# Patient Record
Sex: Female | Born: 1975 | Race: White | Hispanic: No | Marital: Single | Smoking: Current every day smoker
Health system: Southern US, Community
[De-identification: ages and names within clinical notes are randomized; demographics above are authoritative.]

## PROBLEM LIST (undated history)

## (undated) ENCOUNTER — Inpatient Hospital Stay (HOSPITAL_COMMUNITY): Payer: Self-pay

## (undated) DIAGNOSIS — F419 Anxiety disorder, unspecified: Secondary | ICD-10-CM

## (undated) DIAGNOSIS — G90521 Complex regional pain syndrome I of right lower limb: Secondary | ICD-10-CM

## (undated) DIAGNOSIS — O09529 Supervision of elderly multigravida, unspecified trimester: Secondary | ICD-10-CM

## (undated) DIAGNOSIS — F32A Depression, unspecified: Secondary | ICD-10-CM

## (undated) DIAGNOSIS — D219 Benign neoplasm of connective and other soft tissue, unspecified: Secondary | ICD-10-CM

## (undated) DIAGNOSIS — Z789 Other specified health status: Secondary | ICD-10-CM

## (undated) DIAGNOSIS — R112 Nausea with vomiting, unspecified: Secondary | ICD-10-CM

## (undated) DIAGNOSIS — F329 Major depressive disorder, single episode, unspecified: Secondary | ICD-10-CM

## (undated) DIAGNOSIS — Z9889 Other specified postprocedural states: Secondary | ICD-10-CM

## (undated) HISTORY — PX: DILATION AND CURETTAGE OF UTERUS: SHX78

## (undated) HISTORY — DX: Supervision of elderly multigravida, unspecified trimester: O09.529

## (undated) HISTORY — DX: Depression, unspecified: F32.A

## (undated) HISTORY — DX: Major depressive disorder, single episode, unspecified: F32.9

## (undated) HISTORY — DX: Benign neoplasm of connective and other soft tissue, unspecified: D21.9

---

## 2002-08-06 ENCOUNTER — Emergency Department (HOSPITAL_COMMUNITY): Admission: EM | Admit: 2002-08-06 | Discharge: 2002-08-07 | Payer: Self-pay | Admitting: Emergency Medicine

## 2002-08-07 ENCOUNTER — Encounter: Payer: Self-pay | Admitting: Emergency Medicine

## 2002-08-10 ENCOUNTER — Emergency Department (HOSPITAL_COMMUNITY): Admission: EM | Admit: 2002-08-10 | Discharge: 2002-08-10 | Payer: Self-pay | Admitting: *Deleted

## 2002-08-10 ENCOUNTER — Encounter: Payer: Self-pay | Admitting: *Deleted

## 2002-08-26 ENCOUNTER — Ambulatory Visit (HOSPITAL_COMMUNITY): Admission: RE | Admit: 2002-08-26 | Discharge: 2002-08-26 | Payer: Self-pay | Admitting: Unknown Physician Specialty

## 2002-08-26 ENCOUNTER — Encounter: Payer: Self-pay | Admitting: Unknown Physician Specialty

## 2002-09-07 ENCOUNTER — Encounter: Admission: RE | Admit: 2002-09-07 | Discharge: 2002-10-20 | Payer: Self-pay | Admitting: Unknown Physician Specialty

## 2008-02-16 ENCOUNTER — Other Ambulatory Visit: Admission: RE | Admit: 2008-02-16 | Discharge: 2008-02-16 | Payer: Self-pay | Admitting: Obstetrics and Gynecology

## 2008-09-07 ENCOUNTER — Inpatient Hospital Stay (HOSPITAL_COMMUNITY): Admission: AD | Admit: 2008-09-07 | Discharge: 2008-09-07 | Payer: Self-pay | Admitting: Family Medicine

## 2008-09-07 ENCOUNTER — Ambulatory Visit: Payer: Self-pay | Admitting: Obstetrics and Gynecology

## 2008-09-09 ENCOUNTER — Inpatient Hospital Stay (HOSPITAL_COMMUNITY): Admission: AD | Admit: 2008-09-09 | Discharge: 2008-09-09 | Payer: Self-pay | Admitting: Obstetrics and Gynecology

## 2008-09-09 ENCOUNTER — Ambulatory Visit: Payer: Self-pay | Admitting: Advanced Practice Midwife

## 2008-09-10 ENCOUNTER — Inpatient Hospital Stay (HOSPITAL_COMMUNITY): Admission: AD | Admit: 2008-09-10 | Discharge: 2008-09-11 | Payer: Self-pay | Admitting: Obstetrics & Gynecology

## 2008-09-10 ENCOUNTER — Ambulatory Visit: Payer: Self-pay | Admitting: Obstetrics & Gynecology

## 2008-09-13 ENCOUNTER — Inpatient Hospital Stay (HOSPITAL_COMMUNITY): Admission: AD | Admit: 2008-09-13 | Discharge: 2008-09-15 | Payer: Self-pay | Admitting: Obstetrics & Gynecology

## 2008-09-13 ENCOUNTER — Ambulatory Visit: Payer: Self-pay | Admitting: Family

## 2010-11-26 LAB — CBC
Hemoglobin: 12.2 g/dL (ref 12.0–15.0)
MCHC: 33.1 g/dL (ref 30.0–36.0)
RBC: 4.08 MIL/uL (ref 3.87–5.11)

## 2010-11-26 LAB — RPR: RPR Ser Ql: NONREACTIVE

## 2010-11-26 LAB — RH IMMUNE GLOB WKUP(>/=20WKS)(NOT WOMEN'S HOSP): Fetal Screen: NEGATIVE

## 2011-02-23 ENCOUNTER — Emergency Department (HOSPITAL_COMMUNITY): Payer: Self-pay

## 2011-02-23 ENCOUNTER — Emergency Department (HOSPITAL_COMMUNITY)
Admission: EM | Admit: 2011-02-23 | Discharge: 2011-02-23 | Disposition: A | Discharge status: Home / Self Care | Payer: Self-pay | Attending: Emergency Medicine | Admitting: Emergency Medicine

## 2011-02-23 ENCOUNTER — Encounter: Payer: Self-pay | Admitting: *Deleted

## 2011-02-23 DIAGNOSIS — S9000XA Contusion of unspecified ankle, initial encounter: Secondary | ICD-10-CM | POA: Insufficient documentation

## 2011-02-23 DIAGNOSIS — R609 Edema, unspecified: Secondary | ICD-10-CM | POA: Insufficient documentation

## 2011-02-23 DIAGNOSIS — F17200 Nicotine dependence, unspecified, uncomplicated: Secondary | ICD-10-CM | POA: Insufficient documentation

## 2011-02-23 DIAGNOSIS — W19XXXA Unspecified fall, initial encounter: Secondary | ICD-10-CM | POA: Insufficient documentation

## 2011-02-23 DIAGNOSIS — M25579 Pain in unspecified ankle and joints of unspecified foot: Secondary | ICD-10-CM | POA: Insufficient documentation

## 2011-02-23 DIAGNOSIS — T148XXA Other injury of unspecified body region, initial encounter: Secondary | ICD-10-CM

## 2011-02-23 MED ORDER — HYDROCODONE-ACETAMINOPHEN 5-325 MG PO TABS: 1.0000 | ORAL_TABLET | ORAL | Status: AC | PRN

## 2011-02-23 MED ORDER — IBUPROFEN 800 MG PO TABS: 800.0000 mg | ORAL_TABLET | Freq: Three times a day (TID) | ORAL | Status: AC

## 2011-02-23 MED ORDER — HYDROCODONE-ACETAMINOPHEN 5-325 MG PO TABS
1.0000 | ORAL_TABLET | Freq: Once | ORAL | Status: AC
  Administered 2011-02-23: 1 via ORAL
  Filled 2011-02-23: qty 1

## 2011-02-23 NOTE — ED Notes (Signed)
Family at bedside. 

## 2011-02-23 NOTE — ED Notes (Signed)
Pt states ran into a rocking chair, striking the side of rt ankle on the rocker bottom. States incident happened at 0900 and swelling started at 1500.  Pain has gradually increased during the day. Ice pack applied.

## 2011-02-26 NOTE — ED Provider Notes (Signed)
History     Chief Complaint  Patient presents with  . Ankle Pain    Pt c/o right ankle pain x 4 hrs   Patient is a 35 y.o. female presenting with ankle pain. The history is provided by the patient.  Ankle Pain  The incident occurred less than 1 hour ago. The incident occurred at home. The injury mechanism was a fall. The pain is present in the right ankle. The quality of the pain is described as aching. The pain is at a severity of 5/10. The pain is moderate. The pain has been constant since onset. Associated symptoms include inability to bear weight. Pertinent negatives include no numbness, no loss of motion, no loss of sensation and no tingling. She reports no foreign bodies present. The symptoms are aggravated by nothing. She has tried nothing for the symptoms.    History reviewed. No pertinent past medical history.  History reviewed. No pertinent past surgical history.  Family History  Problem Relation Age of Onset  . Hypertension Father     History  Substance Use Topics  . Smoking status: Current Everyday Smoker  . Smokeless tobacco: Not on file  . Alcohol Use: No    OB History    Grav Para Term Preterm Abortions TAB SAB Ect Mult Living                  Review of Systems  Constitutional: Negative for fever.  HENT: Negative for congestion, sore throat and neck pain.   Eyes: Negative.   Respiratory: Negative for chest tightness and shortness of breath.   Cardiovascular: Negative for chest pain.  Gastrointestinal: Negative for nausea and abdominal pain.  Genitourinary: Negative.   Musculoskeletal: Positive for joint swelling, arthralgias and gait problem. Negative for back pain.  Skin: Negative.  Negative for rash and wound.  Neurological: Negative for dizziness, tingling, weakness, light-headedness, numbness and headaches.  Hematological: Negative.   Psychiatric/Behavioral: Negative.     Physical Exam  BP 116/57  Pulse 75  Temp(Src) 98.4 F (36.9 C) (Oral)   Resp 18  Ht 5\' 9"  (1.753 m)  Wt 185 lb (83.915 kg)  BMI 27.32 kg/m2  SpO2 100%  LMP 02/03/2011  Physical Exam  Vitals reviewed. Constitutional: She is oriented to person, place, and time. She appears well-developed and well-nourished.  HENT:  Head: Normocephalic and atraumatic.  Eyes: Conjunctivae are normal.  Neck: Normal range of motion.  Cardiovascular: Normal rate.   Pulmonary/Chest: Effort normal. She has no wheezes.  Abdominal: Soft. There is no tenderness.  Musculoskeletal: She exhibits edema and tenderness.       Right ankle: She exhibits swelling and ecchymosis. She exhibits no deformity, no laceration and normal pulse. tenderness. Lateral malleolus tenderness found. No proximal fibula tenderness found. Achilles tendon normal.  Neurological: She is alert and oriented to person, place, and time.  Skin: Skin is warm and dry.  Psychiatric: She has a normal mood and affect.    ED Course  Procedures  MDM  Sprain ankle/soft tissue injury.  [place],  Crutches supplied by [employer], [place] 02/26/11 1142

## 2011-04-11 NOTE — ED Provider Notes (Signed)
Medical screening examination/treatment/procedure(s) were performed by non-physician practitioner and as supervising physician I was immediately available for consultation/collaboration.  Hurman Horn, MD 04/11/11 2112

## 2011-04-11 NOTE — ED Provider Notes (Signed)
Medical screening examination/treatment/procedure(s) were performed by non-physician practitioner and as supervising physician I was immediately available for consultation/collaboration.  Hurman Horn, MD 04/11/11 2110

## 2011-07-10 LAB — OB RESULTS CONSOLE ABO/RH: RH Type: NEGATIVE

## 2011-07-10 LAB — OB RESULTS CONSOLE RPR: RPR: NONREACTIVE

## 2011-07-10 LAB — OB RESULTS CONSOLE GC/CHLAMYDIA
Chlamydia: NEGATIVE
Gonorrhea: NEGATIVE

## 2011-07-10 LAB — OB RESULTS CONSOLE HEPATITIS B SURFACE ANTIGEN: Hepatitis B Surface Ag: NEGATIVE

## 2011-07-10 LAB — OB RESULTS CONSOLE RUBELLA ANTIBODY, IGM: Rubella: IMMUNE

## 2011-09-29 ENCOUNTER — Ambulatory Visit (HOSPITAL_COMMUNITY)
Admission: RE | Admit: 2011-09-29 | Discharge: 2011-09-29 | Disposition: A | Discharge status: Home / Self Care | Payer: BC Managed Care – PPO | Source: Ambulatory Visit | Attending: Obstetrics & Gynecology | Admitting: Obstetrics & Gynecology

## 2011-09-29 ENCOUNTER — Other Ambulatory Visit: Payer: Self-pay | Admitting: Obstetrics & Gynecology

## 2011-09-29 DIAGNOSIS — O1200 Gestational edema, unspecified trimester: Secondary | ICD-10-CM

## 2011-09-29 DIAGNOSIS — M7989 Other specified soft tissue disorders: Secondary | ICD-10-CM | POA: Insufficient documentation

## 2011-09-29 DIAGNOSIS — M79609 Pain in unspecified limb: Secondary | ICD-10-CM | POA: Insufficient documentation

## 2012-01-12 ENCOUNTER — Encounter (HOSPITAL_COMMUNITY): Payer: Self-pay | Admitting: *Deleted

## 2012-01-12 ENCOUNTER — Inpatient Hospital Stay (HOSPITAL_COMMUNITY)
Admission: AD | Admit: 2012-01-12 | Discharge: 2012-01-12 | Disposition: A | Discharge status: Home / Self Care | Payer: BC Managed Care – PPO | Source: Ambulatory Visit | Attending: Obstetrics and Gynecology | Admitting: Obstetrics and Gynecology

## 2012-01-12 DIAGNOSIS — O99891 Other specified diseases and conditions complicating pregnancy: Secondary | ICD-10-CM | POA: Insufficient documentation

## 2012-01-12 DIAGNOSIS — Z3490 Encounter for supervision of normal pregnancy, unspecified, unspecified trimester: Secondary | ICD-10-CM

## 2012-01-12 LAB — URINALYSIS, ROUTINE W REFLEX MICROSCOPIC
Bilirubin Urine: NEGATIVE
Glucose, UA: NEGATIVE mg/dL
Hgb urine dipstick: NEGATIVE
Protein, ur: NEGATIVE mg/dL
Specific Gravity, Urine: <1.005 — ABNORMAL LOW (ref 1.005–1.030)

## 2012-01-12 NOTE — MAU Note (Signed)
PT SAYS AT 0400- SHE WAS AT WORK - WENT TO B-ROOM-   SAW STRINGY  MUCUS. .  DENIES UC.

## 2012-01-12 NOTE — MAU Provider Note (Signed)
History    CSN: [id number] Arrival date and time: 01/12/12 0531  First Provider Initiated Contact with Patient 01/12/12 0631   Chief Complaint  Patient presents with  . Labor Eval   HPI Comments: Pt presents reporting loss of mucous plug approximately 2.5hours prior to arrival.  She was at work on when it occurred.  She denies any contractions.  3 day history of back pain radiating to her B inguinal region.  No hesitancy, dysuria, hematuria or new frequency,  No decreased fetal movement.  Pt reports on her feet for 8 hours/day at work.  No other complaints at this time.   OB History    Grav Para Term Preterm Abortions TAB SAB Ect Mult Living   4 1 1  2 2    1      History reviewed. No pertinent past medical history.  History reviewed. No pertinent past surgical history.  Family History  Problem Relation Age of Onset  . Hypertension Father    History  Substance Use Topics  . Smoking status: Current Some Day Smoker -- 0.3 packs/day  . Smokeless tobacco: Not on file  . Alcohol Use: No   Allergies: No Known Allergies  Prescriptions prior to admission  Medication Sig Dispense Refill  . Multiple Vitamin (MULTIVITAMIN) tablet Take 1 tablet by mouth daily.      Marland Kitchen DISCONTD: azithromycin (ZITHROMAX) 1 G powder Take 1 packet by mouth once.      Marland Kitchen DISCONTD: Norethindrone (ORTHO MICRONOR PO) Take by mouth.         Review of Systems  Constitutional: Positive for malaise/fatigue. Negative for fever and chills.  Eyes: Negative for blurred vision and double vision.  Respiratory: Negative for cough.   Cardiovascular: Positive for leg swelling (unchaged).  Gastrointestinal: Negative.   Genitourinary: Negative.   Musculoskeletal: Negative.   Skin: Negative for itching and rash.  Neurological: Negative.  Negative for weakness and headaches.  Endo/Heme/Allergies: Negative.   Psychiatric/Behavioral: Negative.    Physical Exam   Blood pressure 98/51, pulse 76, temperature 97.6 F (36.4  C), temperature source Oral, resp. rate 18, height 5' 9.5" (1.765 m), weight 89.812 kg (198 lb), last menstrual period 02/03/2011, SpO2 100.00%.  Physical Exam  Nursing note and vitals reviewed. Constitutional: She is oriented to person, place, and time. She appears well-developed and well-nourished. No distress.       Appears fatigued  HENT:  Head: Normocephalic and atraumatic.  Eyes: Right eye exhibits no discharge. Left eye exhibits no discharge. No scleral icterus.  Neck: No tracheal deviation present. No thyromegaly present.  Cardiovascular: Normal rate, regular rhythm and normal heart sounds.  Exam reveals no gallop and no friction rub.   No murmur heard. Respiratory: Effort normal and breath sounds normal. No respiratory distress. She has no wheezes. She has no rales. She exhibits no tenderness.  GI: Soft. Bowel sounds are normal. She exhibits distension and mass. There is no tenderness. There is no rebound and no guarding.  Musculoskeletal: She exhibits no edema.  Neurological: She is alert and oriented to person, place, and time.  Skin: Skin is warm and dry. She is not diaphoretic.  Psychiatric: She has a normal mood and affect. Her behavior is normal. Judgment and thought content normal.    Fetal Monitoring - Category 1  MAU Course  Procedures  MDM Urinalysis - negative for UTI Cervical Exam - deferred as no contractions on monitor and will not change management  Assessment and Plan  Loss of mucous plug,  Pt not in labor.  D/c to home.  Andrena Mews, DO [facility] Resident - PGY-1 01/12/2012 7:13 AM  Patient seen and examined.  Agree with above note.  Levie Heritage, DO 01/12/2012 7:59 AM

## 2012-01-12 NOTE — Discharge Instructions (Signed)
Normal Labor and Delivery Your caregiver must first be sure you are in labor. Signs of labor include:  You may pass what is called "the mucus plug" before labor begins. This is a small amount of blood stained mucus.   Regular uterine contractions.   The time between contractions get closer together.   The discomfort and pain gradually gets more intense.   Pains are mostly located in the back.   Pains get worse when walking.   The cervix (the opening of the uterus becomes thinner (begins to efface) and opens up (dilates).  Once you are in labor and admitted into the hospital or care center, your caregiver will do the following:  A complete physical examination.   Check your vital signs (blood pressure, pulse, temperature and the fetal heart rate).   Do a vaginal examination (using a sterile glove and lubricant) to determine:   The position (presentation) of the baby (head [vertex] or buttock first).   The level (station) of the baby's head in the birth canal.   The effacement and dilatation of the cervix.   You may have your pubic hair shaved and be given an enema depending on your caregiver and the circumstance.   An electronic monitor is usually placed on your abdomen. The monitor follows the length and intensity of the contractions, as well as the baby's heart rate.   Usually, your caregiver will insert an IV in your arm with a bottle of sugar water. This is done as a precaution so that medications can be given to you quickly during labor or delivery.  NORMAL LABOR AND DELIVERY IS DIVIDED UP INTO 3 STAGES: First Stage This is when regular contractions begin and the cervix begins to efface and dilate. This stage can last from 3 to 15 hours. The end of the first stage is when the cervix is 100% effaced and 10 centimeters dilated. Pain medications may be given by   Injection (morphine, demerol, etc.)   Regional anesthesia (spinal, caudal or epidural, anesthetics given in  different locations of the spine). Paracervical pain medication may be given, which is an injection of and anesthetic on each side of the cervix.  A pregnant woman may request to have "Natural Childbirth" which is not to have any medications or anesthesia during her labor and delivery. Second Stage This is when the baby comes down through the birth canal (vagina) and is born. This can take 1 to 4 hours. As the baby's head comes down through the birth canal, you may feel like you are going to have a bowel movement. You will get the urge to bear down and push until the baby is delivered. As the baby's head is being delivered, the caregiver will decide if an episiotomy (a cut in the perineum and vagina area) is needed to prevent tearing of the tissue in this area. The episiotomy is sewn up after the delivery of the baby and placenta. Sometimes a mask with nitrous oxide is given for the mother to breath during the delivery of the baby to help if there is too much pain. The end of Stage 2 is when the baby is fully delivered. Then when the umbilical cord stops pulsating it is clamped and cut. Third Stage The third stage begins after the baby is completely delivered and ends after the placenta (afterbirth) is delivered. This usually takes 5 to 30 minutes. After the placenta is delivered, a medication is given either by intravenous or injection to help contract   the uterus and prevent bleeding. The third stage is not painful and pain medication is usually not necessary. If an episiotomy was done, it is repaired at this time. After the delivery, the mother is watched and monitored closely for 1 to 2 hours to make sure there is no postpartum bleeding (hemorrhage). If there is a lot of bleeding, medication is given to contract the uterus and stop the bleeding. Document Released: 05/06/2008 Document Revised: 07/17/2011 Document Reviewed: 05/06/2008 [facility] Patient Information 2012 [place], [place].   Pregnancy - Third  Trimester The third trimester of pregnancy (the last 3 months) is a period of the most rapid growth for you and your baby. The baby approaches a length of 20 inches and a weight of 6 to 10 pounds. The baby is adding on fat and getting ready for life outside your body. While inside, babies have periods of sleeping and waking, suck their thumbs, and hiccups. You can often feel small contractions of the uterus. This is false labor. It is also called Braxton-Hicks contractions. This is like a practice for labor. The usual problems in this stage of pregnancy include more difficulty breathing, swelling of the hands and feet from water retention, and having to urinate more often because of the uterus and baby pressing on your bladder.  PRENATAL EXAMS  Blood work may continue to be done during prenatal exams. These tests are done to check on your health and the probable health of your baby. Blood work is used to follow your blood levels (hemoglobin). Anemia (low hemoglobin) is common during pregnancy. Iron and vitamins are given to help prevent this. You may also continue to be checked for diabetes. Some of the past blood tests may be done again.   The size of the uterus is measured during each visit. This makes sure your baby is growing properly according to your pregnancy dates.   Your blood pressure is checked every prenatal visit. This is to make sure you are not getting toxemia.   Your urine is checked every prenatal visit for infection, diabetes and protein.   Your weight is checked at each visit. This is done to make sure gains are happening at the suggested rate and that you and your baby are growing normally.   Sometimes, an ultrasound is performed to confirm the position and the proper growth and development of the baby. This is a test done that bounces harmless sound waves off the baby so your caregiver can more accurately determine due dates.   Discuss the type of pain medication and anesthesia  you will have during your labor and delivery.   Discuss the possibility and anesthesia if a Cesarean Section might be necessary.   Inform your caregiver if there is any mental or physical violence at home.  Sometimes, a specialized non-stress test, contraction stress test and biophysical profile are done to make sure the baby is not having a problem. Checking the amniotic fluid surrounding the baby is called an amniocentesis. The amniotic fluid is removed by sticking a needle into the belly (abdomen). This is sometimes done near the end of pregnancy if an early delivery is required. In this case, it is done to help make sure the baby's lungs are mature enough for the baby to live outside of the womb. If the lungs are not mature and it is unsafe to deliver the baby, an injection of cortisone medication is given to the mother 1 to 2 days before the delivery. This helps the baby's  lungs mature and makes it safer to deliver the baby. CHANGES OCCURING IN THE THIRD TRIMESTER OF PREGNANCY Your body goes through many changes during pregnancy. They vary from person to person. Talk to your caregiver about changes you notice and are concerned about.  During the last trimester, you have probably had an increase in your appetite. It is normal to have cravings for certain foods. This varies from person to person and pregnancy to pregnancy.   You may begin to get stretch marks on your hips, abdomen, and breasts. These are normal changes in the body during pregnancy. There are no exercises or medications to take which prevent this change.   Constipation may be treated with a stool softener or adding bulk to your diet. Drinking lots of fluids, fiber in vegetables, fruits, and whole grains are helpful.   Exercising is also helpful. If you have been very active up until your pregnancy, most of these activities can be continued during your pregnancy. If you have been less active, it is helpful to start an exercise  program such as walking. Consult your caregiver before starting exercise programs.   Avoid all smoking, alcohol, un-prescribed drugs, herbs and "street drugs" during your pregnancy. These chemicals affect the formation and growth of the baby. Avoid chemicals throughout the pregnancy to ensure the delivery of a healthy infant.   Backache, varicose veins and hemorrhoids may develop or get worse.   You will tire more easily in the third trimester, which is normal.   The baby's movements may be stronger and more often.   You may become short of breath easily.   Your belly button may stick out.   A yellow discharge may leak from your breasts called colostrum.   You may have a bloody mucus discharge. This usually occurs a few days to a week before labor begins.  HOME CARE INSTRUCTIONS   Keep your caregiver's appointments. Follow your caregiver's instructions regarding medication use, exercise, and diet.   During pregnancy, you are providing food for you and your baby. Continue to eat regular, well-balanced meals. Choose foods such as meat, fish, milk and other low fat dairy products, vegetables, fruits, and whole-grain breads and cereals. Your caregiver will tell you of the ideal weight gain.   A physical sexual relationship may be continued throughout pregnancy if there are no other problems such as early (premature) leaking of amniotic fluid from the membranes, vaginal bleeding, or belly (abdominal) pain.   Exercise regularly if there are no restrictions. Check with your caregiver if you are unsure of the safety of your exercises. Greater weight gain will occur in the last 2 trimesters of pregnancy. Exercising helps:   Control your weight.   Get you in shape for labor and delivery.   You lose weight after you deliver.   Rest a lot with legs elevated, or as needed for leg cramps or low back pain.   Wear a good support or jogging bra for breast tenderness during pregnancy. This may help  if worn during sleep. Pads or tissues may be used in the bra if you are leaking colostrum.   Do not use hot tubs, steam rooms, or saunas.   Wear your seat belt when driving. This protects you and your baby if you are in an accident.   Avoid raw meat, cat litter boxes and soil used by cats. These carry germs that can cause birth defects in the baby.   It is easier to loose urine during pregnancy.  Tightening up and strengthening the pelvic muscles will help with this problem. You can practice stopping your urination while you are going to the bathroom. These are the same muscles you need to strengthen. It is also the muscles you would use if you were trying to stop from passing gas. You can practice tightening these muscles up 10 times a set and repeating this about 3 times per day. Once you know what muscles to tighten up, do not perform these exercises during urination. It is more likely to cause an infection by backing up the urine.   Ask for help if you have financial, counseling or nutritional needs during pregnancy. Your caregiver will be able to offer counseling for these needs as well as refer you for other special needs.   Make a list of emergency phone numbers and have them available.   Plan on getting help from family or friends when you go home from the hospital.   Make a trial run to the hospital.   Take prenatal classes with the father to understand, practice and ask questions about the labor and delivery.   Prepare the baby's room/nursery.   Do not travel out of the city unless it is absolutely necessary and with the advice of your caregiver.   Wear only low or no heal shoes to have better balance and prevent falling.  MEDICATIONS AND DRUG USE IN PREGNANCY  Take prenatal vitamins as directed. The vitamin should contain 1 milligram of folic acid. Keep all vitamins out of reach of children. Only a couple vitamins or tablets containing iron may be fatal to a baby or young child  when ingested.   Avoid use of all medications, including herbs, over-the-counter medications, not prescribed or suggested by your caregiver. Only take over-the-counter or prescription medicines for pain, discomfort, or fever as directed by your caregiver. Do not use aspirin, ibuprofen (Motrin, Advil, Nuprin) or naproxen (Aleve) unless OK'd by your caregiver.   Let your caregiver also know about herbs you may be using.   Alcohol is related to a number of birth defects. This includes fetal alcohol syndrome. All alcohol, in any form, should be avoided completely. Smoking will cause low birth rate and premature babies.   Street/illegal drugs are very harmful to the baby. They are absolutely forbidden. A baby born to an addicted mother will be addicted at birth. The baby will go through the same withdrawal an adult does.  SEEK MEDICAL CARE IF: You have any concerns or worries during your pregnancy. It is better to call with your questions if you feel they cannot wait, rather than worry about them. DECISIONS ABOUT CIRCUMCISION You may or may not know the sex of your baby. If you know your baby is a boy, it may be time to think about circumcision. Circumcision is the removal of the foreskin of the penis. This is the skin that covers the sensitive end of the penis. There is no proven medical need for this. Often this decision is made on what is popular at the time or based upon religious beliefs and social issues. You can discuss these issues with your caregiver or pediatrician. SEEK IMMEDIATE MEDICAL CARE IF:   An unexplained oral temperature above 102 F (38.9 C) develops, or as your caregiver suggests.   You have leaking of fluid from the vagina (birth canal). If leaking membranes are suspected, take your temperature and tell your caregiver of this when you call.   There is vaginal spotting, bleeding or  passing clots. Tell your caregiver of the amount and how many pads are used.   You develop a  bad smelling vaginal discharge with a change in the color from clear to white.   You develop vomiting that lasts more than 24 hours.   You develop chills or fever.   You develop shortness of breath.   You develop burning on urination.   You loose more than 2 pounds of weight or gain more than 2 pounds of weight or as suggested by your caregiver.   You notice sudden swelling of your face, hands, and feet or legs.   You develop belly (abdominal) pain. Round ligament discomfort is a common non-cancerous (benign) cause of abdominal pain in pregnancy. Your caregiver still must evaluate you.   You develop a severe headache that does not go away.   You develop visual problems, blurred or double vision.   If you have not felt your baby move for more than 1 hour. If you think the baby is not moving as much as usual, eat something with sugar in it and lie down on your left side for an hour. The baby should move at least 4 to 5 times per hour. Call right away if your baby moves less than that.   You fall, are in a car accident or any kind of trauma.   There is mental or physical violence at home.  Document Released: 07/22/2001 Document Revised: 07/17/2011 Document Reviewed: 01/24/2009 [facility] Patient Information 2012 [place], [place].

## 2012-01-12 NOTE — MAU Note (Signed)
Pt states "I think I lost my mucus plug", lower back/abd pain off/on x 3 days. Denies bleeding or Gush of fluid. G2P1.

## 2012-01-12 NOTE — MAU Note (Signed)
SAYS STARTED HAVING BACK ACHE ON Friday- RADIATES TO FRONT-  NO VE IN   OFFICE.

## 2012-01-20 LAB — OB RESULTS CONSOLE GBS: GBS: NEGATIVE

## 2012-02-10 ENCOUNTER — Telehealth (HOSPITAL_COMMUNITY): Payer: Self-pay | Admitting: *Deleted

## 2012-02-10 ENCOUNTER — Other Ambulatory Visit: Payer: Self-pay | Admitting: Obstetrics and Gynecology

## 2012-02-10 ENCOUNTER — Encounter (HOSPITAL_COMMUNITY): Payer: Self-pay | Admitting: *Deleted

## 2012-02-10 NOTE — Telephone Encounter (Signed)
Preadmission screen  

## 2012-02-10 NOTE — H&P (Signed)
  Emma Johnson is a 36 y.o. female presenting for IOL at 40+1 wk, at patient request, due to pregnancy discomforts. Prenatal course notable for AMA, with Integrated testing placing DSR at 1:1500 Age risk 1:170 History OB History    Grav Para Term Preterm Abortions TAB SAB Ect Mult Living   4 1 1  2 2    1      Past Medical History  Diagnosis Date  . AMA (advanced maternal age) multigravida 35+    No past surgical history on file. Family History: family history includes Hypertension in her father. Social History:  reports that she has been smoking.  She does not have any smokeless tobacco history on file. She reports that she does not drink alcohol or use illicit drugs.   Prenatal Transfer Tool  Maternal Diabetes: No Genetic Screening: Normal DSR  08/1498 ON iNTegrated screening Maternal Ultrasounds/Referrals: Normal Fetal Ultrasounds or other Referrals:  None Maternal Substance Abuse:  No Significant Maternal Medications:  None Significant Maternal Lab Results:  Lab values include: Other: see prenatal record Anegative, received Rhophylac Other Comments:  None  ROSGood fetal movement    Last menstrual period 02/03/2011. Exam Physical Exam Physical Examination: General appearance - alert, well appearing, and in no distress, oriented to person, place, and time, normal appearing weight and tired Mental status - normal mood, behavior, speech, dress, motor activity, and thought processes Mouth - mucous membranes moist, pharynx normal without lesions and normal dentition Chest - clear to auscultation, no wheezes, rales or rhonchi, symmetric air entry Heart - normal rate and regular rhythm Abdomen - gravid uterus 39 cm c/w dates Pelvic - normal external genitalia, vulva, vagina, cervix, uterus and adnexa, CERVIX: normal appearing cervix without discharge or lesions, cervix 2cm/50%/-2/posterior/soft/  Prenatal labs: ABO, Rh: A/Negative/-- (11/29 0000) Antibody:   negative Rubella: Immune (11/29 0000) RPR: Nonreactive (11/29 0000)  HBsAg: Negative (11/29 0000)  HIV: Non-reactive (11/29 0000)  GBS: Negative (06/11 0000)   Assessment/Plan: Pregnancy 40+1 wks , Induction of labor, patient request,cervical favorability   Gina Costilla V 02/10/2012, 3:19 PM

## 2012-02-11 ENCOUNTER — Encounter (HOSPITAL_COMMUNITY): Payer: Self-pay | Admitting: *Deleted

## 2012-02-11 ENCOUNTER — Encounter (HOSPITAL_COMMUNITY): Payer: Self-pay

## 2012-02-11 ENCOUNTER — Telehealth (HOSPITAL_COMMUNITY): Payer: Self-pay | Admitting: *Deleted

## 2012-02-11 ENCOUNTER — Inpatient Hospital Stay (HOSPITAL_COMMUNITY)
Admission: RE | Admit: 2012-02-11 | Discharge: 2012-02-13 | DRG: 373 | Disposition: A | Discharge status: Home / Self Care | Payer: BC Managed Care – PPO | Source: Ambulatory Visit | Attending: Obstetrics and Gynecology | Admitting: Obstetrics and Gynecology

## 2012-02-11 DIAGNOSIS — O09529 Supervision of elderly multigravida, unspecified trimester: Secondary | ICD-10-CM

## 2012-02-11 DIAGNOSIS — O481 Prolonged pregnancy: Secondary | ICD-10-CM | POA: Diagnosis present

## 2012-02-11 HISTORY — DX: Other specified health status: Z78.9

## 2012-02-11 LAB — CBC
MCHC: 34 g/dL (ref 30.0–36.0)
RDW: 14.3 % (ref 11.5–15.5)
WBC: 11.9 10*3/uL — ABNORMAL HIGH (ref 4.0–10.5)

## 2012-02-11 MED ORDER — FLEET ENEMA 7-19 GM/118ML RE ENEM: 1.0000 | ENEMA | RECTAL | Status: DC | PRN

## 2012-02-11 MED ORDER — OXYTOCIN 40 UNITS IN LACTATED RINGERS INFUSION - SIMPLE MED: 62.5000 mL/h | Freq: Once | INTRAVENOUS | Status: DC

## 2012-02-11 MED ORDER — ONDANSETRON HCL 4 MG/2ML IJ SOLN
4.0000 mg | Freq: Four times a day (QID) | INTRAMUSCULAR | Status: DC | PRN
  Administered 2012-02-12: 4 mg via INTRAVENOUS
  Filled 2012-02-11: qty 2

## 2012-02-11 MED ORDER — LACTATED RINGERS IV SOLN
INTRAVENOUS | Status: DC
  Administered 2012-02-11 – 2012-02-12 (×2): via INTRAVENOUS

## 2012-02-11 MED ORDER — ACETAMINOPHEN 325 MG PO TABS: 650.0000 mg | ORAL_TABLET | ORAL | Status: DC | PRN

## 2012-02-11 MED ORDER — OXYTOCIN 40 UNITS IN LACTATED RINGERS INFUSION - SIMPLE MED
1.0000 m[IU]/min | INTRAVENOUS | Status: DC
  Administered 2012-02-11: 2 m[IU]/min via INTRAVENOUS
  Filled 2012-02-11: qty 1000

## 2012-02-11 MED ORDER — CITRIC ACID-SODIUM CITRATE 334-500 MG/5ML PO SOLN: 30.0000 mL | ORAL | Status: DC | PRN

## 2012-02-11 MED ORDER — ZOLPIDEM TARTRATE 5 MG PO TABS: 5.0000 mg | ORAL_TABLET | Freq: Every evening | ORAL | Status: DC | PRN

## 2012-02-11 MED ORDER — OXYCODONE-ACETAMINOPHEN 5-325 MG PO TABS: 1.0000 | ORAL_TABLET | ORAL | Status: DC | PRN

## 2012-02-11 MED ORDER — LIDOCAINE HCL (PF) 1 % IJ SOLN
30.0000 mL | INTRAMUSCULAR | Status: DC | PRN
  Filled 2012-02-11: qty 30

## 2012-02-11 MED ORDER — NALBUPHINE SYRINGE 5 MG/0.5 ML
5.0000 mg | INJECTION | INTRAMUSCULAR | Status: DC | PRN
  Administered 2012-02-11 – 2012-02-12 (×2): 5 mg via INTRAVENOUS
  Filled 2012-02-11 (×2): qty 0.5

## 2012-02-11 MED ORDER — IBUPROFEN 600 MG PO TABS: 600.0000 mg | ORAL_TABLET | Freq: Four times a day (QID) | ORAL | Status: DC | PRN

## 2012-02-11 MED ORDER — OXYTOCIN BOLUS FROM INFUSION
250.0000 mL | Freq: Once | INTRAVENOUS | Status: AC
  Administered 2012-02-12: 250 mL via INTRAVENOUS
  Filled 2012-02-11: qty 500

## 2012-02-11 MED ORDER — LACTATED RINGERS IV SOLN: 500.0000 mL | INTRAVENOUS | Status: DC | PRN

## 2012-02-11 MED ORDER — TERBUTALINE SULFATE 1 MG/ML IJ SOLN: 0.2500 mg | Freq: Once | INTRAMUSCULAR | Status: AC | PRN

## 2012-02-11 NOTE — H&P (Signed)
S: This is a 36 year old G4P1021 at [redacted]w[redacted]d by LMP presenting with IOL for discomforts of pregnancy.  Complications this pregnancy: AMA  Contractions: none Membranes: intact Vaginal bleeding: n/a Vaginal discharge: n/a Fetal movement: present  O:  Filed Vitals:   02/11/12 2009  BP: 112/40  Pulse: 89  Temp: 97.9 F (36.6 C)  Resp: 18    Review of Systems: General: negative for - chills, fever or night sweats Psychological ROS: negative Ophthalmic ROS: negative ENT ROS: negative Allergy and Immunology ROS: negative Hematological and Lymphatic ROS: negative Endocrine ROS: negative for - hot flashes or palpitations Respiratory ROS: no cough, shortness of breath, or wheezing Cardiovascular ROS: no chest pain or dyspnea on exertion Gastrointestinal ROS: no abdominal pain, change in bowel habits, or black or bloody stools Genito-Urinary ROS: no dysuria, trouble voiding, or hematuria Musculoskeletal ROS: negative Neurological ROS: no TIA or stroke symptoms Dermatological ROS: negative  Physical Examination:  General appearance - alert, well appearing, and in no distress Mental status - alert, oriented to person, place, and time Eyes - EOMI Mouth - mucous membranes moist, pharynx normal without lesions Chest - clear to auscultation, no wheezes, rales or rhonchi, symmetric air entry Heart - normal rate, regular rhythm, normal S1, S2, no murmurs, rubs, clicks or gallops Abdomen - gravid, size c/w dates, soft, nontender, no masses or organomegaly Back exam - full range of motion, no tenderness, palpable spasm or pain on motion Neurological - alert, oriented, normal speech, no focal findings or movement disorder noted Musculoskeletal - no joint tenderness, deformity or swelling Extremities - peripheral pulses normal, no pedal edema, no clubbing or cyanosis Skin - normal coloration and turgor, no rashes, no suspicious skin lesions noted  Dilation: 2 Effacement (%): 80 Cervical  Position: Posterior Station: -1 Presentation: Vertex Exam by:: Artelia Laroche CNM Spec exam: not performed FHT: 135, mod variability, + accels, no decels Ctx: none  Prenatal labs: ABO, Rh:  A negative Antibody:  Neg Rubella:  Imm RPR:   NR HBsAg: Neg  HIV:   Neg GBS:   Negative HSV: Negative Hgb/Plt:  12.9 / 176 2hr GTT: 74, 92, 97 1st trimester screen: Integrated screen negative  Labs:  No results found for this or any previous visit (from the past 24 hour(s)).    A/P: 36 year old G4P1021 @ [redacted]w[redacted]d presents with IOL 1. Admit to L&D 2.   Pitocin Induction of Labor, anticipate vaginal delivery 3. Antibiotics: none 4. Continuous toco/FHT  Seen and examined by me also. Agree with Note. Also seen H&P by Dr Emelda Fear.   Wynelle Bourgeois CNM

## 2012-02-11 NOTE — Telephone Encounter (Signed)
Preadmission screen  

## 2012-02-12 ENCOUNTER — Encounter (HOSPITAL_COMMUNITY): Payer: Self-pay

## 2012-02-12 ENCOUNTER — Inpatient Hospital Stay (HOSPITAL_COMMUNITY): Payer: BC Managed Care – PPO | Admitting: Anesthesiology

## 2012-02-12 ENCOUNTER — Encounter (HOSPITAL_COMMUNITY): Payer: Self-pay | Admitting: Anesthesiology

## 2012-02-12 DIAGNOSIS — O481 Prolonged pregnancy: Secondary | ICD-10-CM | POA: Diagnosis present

## 2012-02-12 LAB — RPR: RPR Ser Ql: NONREACTIVE

## 2012-02-12 MED ORDER — OXYCODONE-ACETAMINOPHEN 5-325 MG PO TABS: 1.0000 | ORAL_TABLET | ORAL | Status: DC | PRN

## 2012-02-12 MED ORDER — WITCH HAZEL-GLYCERIN EX PADS: 1.0000 "application " | MEDICATED_PAD | CUTANEOUS | Status: DC | PRN

## 2012-02-12 MED ORDER — DIPHENHYDRAMINE HCL 50 MG/ML IJ SOLN: 12.5000 mg | INTRAMUSCULAR | Status: DC | PRN

## 2012-02-12 MED ORDER — ZOLPIDEM TARTRATE 5 MG PO TABS: 5.0000 mg | ORAL_TABLET | Freq: Every evening | ORAL | Status: DC | PRN

## 2012-02-12 MED ORDER — PHENYLEPHRINE 40 MCG/ML (10ML) SYRINGE FOR IV PUSH (FOR BLOOD PRESSURE SUPPORT): 80.0000 ug | PREFILLED_SYRINGE | INTRAVENOUS | Status: DC | PRN

## 2012-02-12 MED ORDER — IBUPROFEN 600 MG PO TABS
600.0000 mg | ORAL_TABLET | Freq: Four times a day (QID) | ORAL | Status: DC
Start: 2012-02-12 — End: 2012-02-13
  Administered 2012-02-12 – 2012-02-13 (×5): 600 mg via ORAL
  Filled 2012-02-12 (×5): qty 1

## 2012-02-12 MED ORDER — LIDOCAINE HCL (PF) 1 % IJ SOLN
INTRAMUSCULAR | Status: DC | PRN
  Administered 2012-02-12 (×3): 4 mL

## 2012-02-12 MED ORDER — PRENATAL MULTIVITAMIN CH
1.0000 | ORAL_TABLET | Freq: Every day | ORAL | Status: DC
  Administered 2012-02-12 – 2012-02-13 (×2): 1 via ORAL
  Filled 2012-02-12 (×2): qty 1

## 2012-02-12 MED ORDER — EPHEDRINE 5 MG/ML INJ: 10.0000 mg | INTRAVENOUS | Status: DC | PRN

## 2012-02-12 MED ORDER — FENTANYL 2.5 MCG/ML BUPIVACAINE 1/10 % EPIDURAL INFUSION (WH - ANES)
14.0000 mL/h | INTRAMUSCULAR | Status: DC
  Administered 2012-02-12: 14 mL/h via EPIDURAL
  Filled 2012-02-12: qty 60

## 2012-02-12 MED ORDER — PHENYLEPHRINE 40 MCG/ML (10ML) SYRINGE FOR IV PUSH (FOR BLOOD PRESSURE SUPPORT)
80.0000 ug | PREFILLED_SYRINGE | INTRAVENOUS | Status: DC | PRN
  Filled 2012-02-12: qty 5

## 2012-02-12 MED ORDER — TETANUS-DIPHTH-ACELL PERTUSSIS 5-2.5-18.5 LF-MCG/0.5 IM SUSP
0.5000 mL | Freq: Once | INTRAMUSCULAR | Status: AC
  Administered 2012-02-13: 0.5 mL via INTRAMUSCULAR
  Filled 2012-02-12: qty 0.5

## 2012-02-12 MED ORDER — EPHEDRINE 5 MG/ML INJ
10.0000 mg | INTRAVENOUS | Status: DC | PRN
  Filled 2012-02-12: qty 4

## 2012-02-12 MED ORDER — SIMETHICONE 80 MG PO CHEW: 80.0000 mg | CHEWABLE_TABLET | ORAL | Status: DC | PRN

## 2012-02-12 MED ORDER — DIBUCAINE 1 % RE OINT: 1.0000 "application " | TOPICAL_OINTMENT | RECTAL | Status: DC | PRN

## 2012-02-12 MED ORDER — ONDANSETRON HCL 4 MG PO TABS: 4.0000 mg | ORAL_TABLET | ORAL | Status: DC | PRN

## 2012-02-12 MED ORDER — ONDANSETRON HCL 4 MG/2ML IJ SOLN: 4.0000 mg | INTRAMUSCULAR | Status: DC | PRN

## 2012-02-12 MED ORDER — BENZOCAINE-MENTHOL 20-0.5 % EX AERO
1.0000 "application " | INHALATION_SPRAY | CUTANEOUS | Status: DC | PRN
  Administered 2012-02-12 – 2012-02-13 (×2): 1 via TOPICAL
  Filled 2012-02-12 (×2): qty 56

## 2012-02-12 MED ORDER — HYDROCODONE-ACETAMINOPHEN 5-325 MG PO TABS
1.0000 | ORAL_TABLET | ORAL | Status: DC | PRN
  Administered 2012-02-12 – 2012-02-13 (×2): 1 via ORAL
  Filled 2012-02-12 (×3): qty 1

## 2012-02-12 MED ORDER — SENNOSIDES-DOCUSATE SODIUM 8.6-50 MG PO TABS
2.0000 | ORAL_TABLET | Freq: Every day | ORAL | Status: DC
  Administered 2012-02-12: 2 via ORAL

## 2012-02-12 MED ORDER — LACTATED RINGERS IV SOLN: 500.0000 mL | Freq: Once | INTRAVENOUS | Status: DC

## 2012-02-12 MED ORDER — DIPHENHYDRAMINE HCL 25 MG PO CAPS
25.0000 mg | ORAL_CAPSULE | Freq: Four times a day (QID) | ORAL | Status: DC | PRN
  Administered 2012-02-12: 25 mg via ORAL
  Filled 2012-02-12: qty 1

## 2012-02-12 MED ORDER — LANOLIN HYDROUS EX OINT: TOPICAL_OINTMENT | CUTANEOUS | Status: DC | PRN

## 2012-02-12 NOTE — Anesthesia Procedure Notes (Signed)
Epidural Patient location during procedure: OB Start time: 02/12/2012 3:33 AM Reason for block: procedure for pain  Staffing Performed by: anesthesiologist   Preanesthetic Checklist Completed: patient identified, site marked, surgical consent, pre-op evaluation, timeout performed, IV checked, risks and benefits discussed and monitors and equipment checked  Epidural Patient position: sitting Prep: site prepped and draped and DuraPrep Patient monitoring: continuous pulse ox and blood pressure Approach: midline Injection technique: LOR air  Needle:  Needle type: Tuohy  Needle gauge: 17 G Needle length: 9 cm Needle insertion depth: 5 cm cm Catheter type: closed end flexible Catheter size: 19 Gauge Catheter at skin depth: 10 cm Test dose: negative  Assessment Events: blood not aspirated, injection not painful, no injection resistance, negative IV test and no paresthesia  Additional Notes Discussed risk of headache, infection, bleeding, nerve injury and failed or incomplete block.  Patient voices understanding and wishes to proceed.

## 2012-02-12 NOTE — Anesthesia Preprocedure Evaluation (Signed)
Anesthesia Evaluation  Patient identified by MRN, date of birth, ID band Patient awake    Reviewed: Allergy & Precautions, H&P , NPO status , Patient's Chart, lab work & pertinent test results, reviewed documented beta blocker date and time   History of Anesthesia Complications Negative for: history of anesthetic complications  Airway Mallampati: II TM Distance: >3 FB Neck ROM: full    Dental  (+) Teeth Intact   Pulmonary Recent URI  (sinus infection beginning of june 2013), Current Smoker,  breath sounds clear to auscultation        Cardiovascular negative cardio ROS  Rhythm:regular Rate:Normal     Neuro/Psych negative neurological ROS  negative psych ROS   GI/Hepatic negative GI ROS, Neg liver ROS,   Endo/Other  negative endocrine ROS  Renal/GU negative Renal ROS     Musculoskeletal   Abdominal   Peds  Hematology negative hematology ROS (+)   Anesthesia Other Findings   Reproductive/Obstetrics (+) Pregnancy                           Anesthesia Physical Anesthesia Plan  ASA: II  Anesthesia Plan: Epidural   Post-op Pain Management:    Induction:   Airway Management Planned:   Additional Equipment:   Intra-op Plan:   Post-operative Plan:   Informed Consent: I have reviewed the patients History and Physical, chart, labs and discussed the procedure including the risks, benefits and alternatives for the proposed anesthesia with the patient or authorized representative who has indicated his/her understanding and acceptance.     Plan Discussed with:   Anesthesia Plan Comments:         Anesthesia Quick Evaluation

## 2012-02-12 NOTE — Progress Notes (Signed)
Patient ID: Emma Johnson, female   DOB: [birth year], 36 y.o.   MRN: [id number] Delivery Note At 6:04 AM a viable and healthy female was delivered via Vaginal, Spontaneous Delivery (Presentation: Right Occiput Anterior).  APGAR: 9, 9; weight .   Placenta status: Intact, Spontaneous.  Cord: 3 vessels with the following complications: None.  Cord pH: not done   Anesthesia: Epidural  Episiotomy: None Lacerations: None Suture Repair: none Est. Blood Loss (mL): 250  Mom to postpartum.  Baby to nursery-stable.  Jesiah Yerby V 02/12/2012, 6:48 AM

## 2012-02-12 NOTE — Progress Notes (Signed)
Emma Johnson is a 36 y.o. [phone] at [redacted]w[redacted]d by ultrasound admitted for induction of labor due to Elective at term.  Subjective:   Objective: BP 102/54  Pulse 75  Temp 97.5 F (36.4 C) (Oral)  Resp 20  Ht 5' 9.5" (1.765 m)  Wt 200 lb (90.719 kg)  BMI 29.11 kg/m2  LMP 02/03/2011      FHT:  FHR: 140 bpm, variability: moderate,  accelerations:  Present,  decelerations:  Absent UC:   regular, every 3 minutes SVE:   Dilation: 2.5 Effacement (%): 80 Station: -1 Exam by:: [employer] RN  Labs: Lab Results  Component Value Date   WBC 11.9* 02/11/2012   HGB 12.7 02/11/2012   HCT 37.4 02/11/2012   MCV 88.4 02/11/2012   PLT 134* 02/11/2012    Assessment / Plan: Induction of labor due to discomforts of pregnancy,  progressing well on pitocin  Labor: Progressing on Pitocin, will continue to increase then AROM Preeclampsia:   Fetal Wellbeing:  Category I Pain Control:  nubain I/D:  n/a Anticipated MOD:  NSVD  Keita [place] 02/12/2012, 1:47 AM

## 2012-02-12 NOTE — Progress Notes (Signed)
Emma Johnson is a 36 y.o. [phone] at [redacted]w[redacted]d by ultrasound admitted for induction of labor due to Elective at term.  Subjective: Comfortable with epidural  Objective: BP 111/72  Pulse 63  Temp 97.4 F (36.3 C) (Oral)  Resp 20  Ht 5' 9.5" (1.765 m)  Wt 200 lb (90.719 kg)  BMI 29.11 kg/m2  SpO2 98%  LMP 02/03/2011      FHT:  FHR: 140 bpm, variability: moderate,  accelerations:  Present,  decelerations:  Absent UC:   regular, every 3 minutes SVE:   Dilation: 6 Effacement (%): 100 Station: -1;0 Exam by:: [employer] RN  Labs: Lab Results  Component Value Date   WBC 11.9* 02/11/2012   HGB 12.7 02/11/2012   HCT 37.4 02/11/2012   MCV 88.4 02/11/2012   PLT 134* 02/11/2012    Assessment / Plan: Induction of labor due to elective,  progressing well on pitocin  Labor: Progressing on Pitocin, will continue to increase then AROM Preeclampsia:   Fetal Wellbeing:  Category I Pain Control:  Epidural I/D:  n/a Anticipated MOD:  NSVD  Mancil Pfenning 02/12/2012, 5:16 AM

## 2012-02-12 NOTE — Anesthesia Postprocedure Evaluation (Signed)
  Anesthesia Post-op Note  Patient: Emma Johnson  Procedure(s) Performed: * No procedures listed *  Patient Location: Mother/Baby  Anesthesia Type: Epidural  Level of Consciousness: awake, alert  and oriented  Airway and Oxygen Therapy: Patient Spontanous Breathing  Post-op Pain: mild  Post-op Assessment: Patient's Cardiovascular Status Stable, Respiratory Function Stable, Patent Airway, No signs of Nausea or vomiting and Pain level controlled  Post-op Vital Signs: stable  Complications: No apparent anesthesia complications

## 2012-02-13 ENCOUNTER — Encounter (HOSPITAL_COMMUNITY): Payer: Self-pay

## 2012-02-13 MED ORDER — RHO D IMMUNE GLOBULIN 1500 UNIT/2ML IJ SOLN
300.0000 ug | Freq: Once | INTRAMUSCULAR | Status: AC
  Administered 2012-02-13: 300 ug via INTRAMUSCULAR
  Filled 2012-02-13: qty 2

## 2012-02-13 MED ORDER — IBUPROFEN 600 MG PO TABS: 600.0000 mg | ORAL_TABLET | Freq: Four times a day (QID) | ORAL | Status: AC | PRN

## 2012-02-13 MED ORDER — HYDROCODONE-ACETAMINOPHEN 5-500 MG PO TABS: 1.0000 | ORAL_TABLET | Freq: Four times a day (QID) | ORAL | Status: AC | PRN

## 2012-02-13 NOTE — Progress Notes (Signed)
Post Partum Day 1 Subjective: no complaints and desires early d/c  Objective: Blood pressure 116/64, pulse 73, temperature 98 F (36.7 C), temperature source Oral, resp. rate 18, height 5' 9.5" (1.765 m), weight 200 lb (90.719 kg), SpO2 98.00%, unknown if currently breastfeeding.  Physical Exam:  General: alert, cooperative and no distress Lochia: appropriate Uterine Fundus: firm Incision: n/a DVT Evaluation: No evidence of DVT seen on physical exam.   Basename 02/11/12 2015  HGB 12.7  HCT 37.4    Assessment/Plan: Discharge home and Contraception Husband had vasectomy   LOS: 2 days   Makail Watling E. 02/13/2012, 7:29 AM

## 2012-02-13 NOTE — Discharge Summary (Signed)
Agree with above note.  Emma Renbarger H. 02/13/2012 8:01 AM

## 2012-02-13 NOTE — Discharge Summary (Signed)
Obstetric Discharge Summary Reason for Admission: induction of labor Prenatal Procedures: NST and ultrasound Intrapartum Procedures: spontaneous vaginal delivery Postpartum Procedures: none Complications-Operative and Postpartum: none Hemoglobin  Date Value Range Status  02/11/2012 12.7  12.0 - 15.0 g/dL Final     HCT  Date Value Range Status  02/11/2012 37.4  36.0 - 46.0 % Final    Physical Exam:  General: alert, cooperative and no distress Lochia: appropriate Uterine Fundus: firm Incision: n/a DVT Evaluation: No evidence of DVT seen on physical exam.  Discharge Diagnoses: Term Pregnancy-delivered and Bottlefeeding  Discharge Information: Date: 02/13/2012 Activity: pelvic rest Diet: routine Medications: Ibuprofen and Vicodin Condition: stable Instructions: refer to practice specific booklet Discharge to: home Follow-up Information    Schedule an appointment as soon as possible for a visit with [facility].   Contact information:   [address] [place] [address]          Newborn Data: Live born female  Birth Weight: 6 lb 13 oz (3090 g) APGAR: 9, 9  Home with mother.  Emma Perine E. 02/13/2012, 7:32 AM

## 2012-02-14 LAB — RH IG WORKUP (INCLUDES ABO/RH)
Antibody Screen: NEGATIVE
Fetal Screen: NEGATIVE
Unit division: 0

## 2012-02-15 NOTE — H&P (Signed)
Duplicate hx:  See similar dictation by me this date.

## 2012-10-23 ENCOUNTER — Encounter: Payer: Self-pay | Admitting: *Deleted

## 2012-10-29 ENCOUNTER — Encounter: Payer: Self-pay | Admitting: Nurse Practitioner

## 2012-10-29 ENCOUNTER — Ambulatory Visit (INDEPENDENT_AMBULATORY_CARE_PROVIDER_SITE_OTHER): Payer: PRIVATE HEALTH INSURANCE | Admitting: Nurse Practitioner

## 2012-10-29 VITALS — BP 112/79 | HR 80 | Wt 189.6 lb

## 2012-10-29 DIAGNOSIS — F411 Generalized anxiety disorder: Secondary | ICD-10-CM

## 2012-10-29 MED ORDER — IBUPROFEN 800 MG PO TABS: 800.0000 mg | ORAL_TABLET | Freq: Three times a day (TID) | ORAL | Status: DC | PRN

## 2012-10-29 MED ORDER — ALPRAZOLAM 0.5 MG PO TABS: ORAL_TABLET | ORAL | Status: DC

## 2012-10-29 NOTE — Progress Notes (Signed)
Subjective: Presents for recheck. Energy level much improved on Wellbutrin. Anxiety has improved. Rare use of Xanax, has not had any for several months. Has reduced the amount of smoking. No insomnia with Wellbutrin if she takes it at the right time. Objective: NAD. Alert, oriented. Lungs clear. Heart regular rate rhythm. Assessment:Generalized anxiety disorder  Plan: Meds ordered this encounter  Medications  . DISCONTD: azithromycin (ZITHROMAX) 250 MG tablet    Sig:   . ALPRAZolam (XANAX) 0.5 MG tablet    Sig: One po BID prn anxiety    Dispense:  30 tablet    Refill:  0    Order Specific Question:  Supervising Provider    Answer:  Merlyn Albert [[id number]]  . ibuprofen (ADVIL,MOTRIN) 800 MG tablet    Sig: Take 1 tablet (800 mg total) by mouth every 8 (eight) hours as needed for pain.    Dispense:  30 tablet    Refill:  0    Order Specific Question:  Supervising Provider    Answer:  Riccardo Dubin

## 2012-11-12 ENCOUNTER — Encounter: Payer: Self-pay | Admitting: Family Medicine

## 2012-11-12 ENCOUNTER — Ambulatory Visit (INDEPENDENT_AMBULATORY_CARE_PROVIDER_SITE_OTHER): Payer: PRIVATE HEALTH INSURANCE | Admitting: Family Medicine

## 2012-11-12 VITALS — BP 110/70 | Temp 97.9°F | Wt 190.2 lb

## 2012-11-12 DIAGNOSIS — H6692 Otitis media, unspecified, left ear: Secondary | ICD-10-CM

## 2012-11-12 DIAGNOSIS — F411 Generalized anxiety disorder: Secondary | ICD-10-CM | POA: Insufficient documentation

## 2012-11-12 DIAGNOSIS — H6690 Otitis media, unspecified, unspecified ear: Secondary | ICD-10-CM

## 2012-11-12 MED ORDER — NABUMETONE 750 MG PO TABS: 750.0000 mg | ORAL_TABLET | Freq: Two times a day (BID) | ORAL | Status: DC

## 2012-11-12 MED ORDER — CEFPROZIL 500 MG PO TABS: 500.0000 mg | ORAL_TABLET | Freq: Two times a day (BID) | ORAL | Status: DC

## 2012-11-12 NOTE — Progress Notes (Signed)
  Subjective:    Patient ID: Emma Johnson, female    DOB: [birth year], 37 y.o.   MRN: [id number]  Otalgia  There is pain in the left ear. This is a new problem. The current episode started in the past 7 days. The problem occurs constantly. The problem has been waxing and waning. There has been no fever. The fever has been present for less than 1 day. The pain is at a severity of 4/10. The pain is moderate. Associated symptoms include headaches (left lat deep ache) and rhinorrhea. She has tried NSAIDs for the symptoms. The treatment provided mild relief. Her past medical history is significant for a chronic ear infection. There is no history of hearing loss.      Review of Systems  HENT: Positive for ear pain and rhinorrhea.   Neurological: Positive for headaches (left lat deep ache).  All other systems reviewed and are negative.       Objective:   Physical Exam  Alert hydration good. Vital signs reviewed. HEENT slight nasal congestion. Left otitis media noted. Tender lymph node left anterior cervical chain. Lungs clear. Heart regular in rhythm.      Assessment & Plan:  Impression #1 otitis media with lymphadenitis. Plan encouraged to stop smoking. Cefzil twice a day for 10 days. Symptomatic care discussed. WSL

## 2012-12-13 ENCOUNTER — Telehealth: Payer: Self-pay | Admitting: Nurse Practitioner

## 2012-12-13 NOTE — Telephone Encounter (Signed)
discusse with pt.

## 2012-12-13 NOTE — Telephone Encounter (Signed)
Only interaction I could find between Wellbutrin and nicotine patch is possible elevation in BP.  If she uses them together, watch her BP and if any elevations, D/C patch.

## 2012-12-13 NOTE — Telephone Encounter (Signed)
Can Patient use smokers patch with Bupropion (Generic Wellbutrin)?  Please call patient

## 2012-12-27 ENCOUNTER — Telehealth: Payer: Self-pay | Admitting: Family Medicine

## 2012-12-27 NOTE — Telephone Encounter (Signed)
Patient says that her anxiety medication isnt working like it use to. She said she had a panic attack last night at work. Please advise.

## 2012-12-27 NOTE — Telephone Encounter (Signed)
Xanax can be changed to 1 mg. One bid prn anxiety attack, use sparingly, i recommend ov specific for panic attacks within 2 weeks. Give 20 no refills, caution drowsiness

## 2012-12-27 NOTE — Telephone Encounter (Signed)
According to chart patient is on Bupropion XL 300 mg qd. Was prescribed Xanax 0.5 mg back on 03/2012.

## 2012-12-28 NOTE — Telephone Encounter (Signed)
Pt notified of med changed and called in to pharmacist at [facility], [place]

## 2012-12-29 ENCOUNTER — Telehealth: Payer: Self-pay | Admitting: Family Medicine

## 2012-12-29 NOTE — Telephone Encounter (Signed)
I have written note for patient as requested.  Left a message (12/29/12 - 11:42am) for her to call the office the information she requested is ready to be picked up.

## 2012-12-29 NOTE — Telephone Encounter (Signed)
plz do as pt asked

## 2012-12-29 NOTE — Telephone Encounter (Signed)
Patient states she has been out of work since Monday May 19, Tuesday May 20, Wednesday 21, 2014 due to aniexity attacks.  Needs the note to state the reason for her absence.  Has an appointment for Friday Dec 31, 2012 at 9:10am with Eber Jones.  Patient states she plans on returning to on Thursday Dec 30, 2012.  Please call patient if we can write the note for work.

## 2012-12-31 ENCOUNTER — Ambulatory Visit (INDEPENDENT_AMBULATORY_CARE_PROVIDER_SITE_OTHER): Payer: PRIVATE HEALTH INSURANCE | Admitting: Nurse Practitioner

## 2012-12-31 ENCOUNTER — Encounter: Payer: Self-pay | Admitting: Nurse Practitioner

## 2012-12-31 VITALS — BP 122/77 | HR 80 | Wt 191.0 lb

## 2012-12-31 DIAGNOSIS — M62838 Other muscle spasm: Secondary | ICD-10-CM

## 2012-12-31 DIAGNOSIS — F410 Panic disorder [episodic paroxysmal anxiety] without agoraphobia: Secondary | ICD-10-CM

## 2012-12-31 DIAGNOSIS — F411 Generalized anxiety disorder: Secondary | ICD-10-CM

## 2012-12-31 DIAGNOSIS — F429 Obsessive-compulsive disorder, unspecified: Secondary | ICD-10-CM

## 2012-12-31 MED ORDER — TIZANIDINE HCL 4 MG PO CAPS: 4.0000 mg | ORAL_CAPSULE | Freq: Three times a day (TID) | ORAL | Status: DC | PRN

## 2012-12-31 MED ORDER — ALPRAZOLAM 1 MG PO TABS: 1.0000 mg | ORAL_TABLET | Freq: Two times a day (BID) | ORAL | Status: DC | PRN

## 2012-12-31 MED ORDER — SERTRALINE HCL 50 MG PO TABS: ORAL_TABLET | ORAL | Status: DC

## 2012-12-31 NOTE — Patient Instructions (Signed)
Need a specialist in anxiety and hoarding

## 2013-01-03 ENCOUNTER — Encounter: Payer: Self-pay | Admitting: Nurse Practitioner

## 2013-01-03 DIAGNOSIS — F429 Obsessive-compulsive disorder, unspecified: Secondary | ICD-10-CM | POA: Insufficient documentation

## 2013-01-03 NOTE — Assessment & Plan Note (Signed)
eneralized anxiety disorder  Obsessive compulsive disorder  Panic attacks  Muscle spasms of head and/or neck  Plan: Meds ordered this encounter  Medications  . DISCONTD: ALPRAZolam (XANAX) 0.5 MG tablet    Sig: Take 1 mg by mouth. One po BID prn anxiety  . tiZANidine (ZANAFLEX) 4 MG capsule    Sig: Take 1 capsule (4 mg total) by mouth 3 (three) times daily as needed for muscle spasms.    Dispense:  30 capsule    Refill:  0    Order Specific Question:  Supervising Provider    Answer:  Merlyn Albert [[id number]]  . sertraline (ZOLOFT) 50 MG tablet    Sig: 1/2 pill po qd x 6 d then one po qd    Dispense:  30 tablet    Refill:  0    Order Specific Question:  Supervising Provider    Answer:  Merlyn Albert [[id number]]  . ALPRAZolam (XANAX) 1 MG tablet    Sig: Take 1 tablet (1 mg total) by mouth 2 (two) times daily as needed for anxiety.    Dispense:  30 tablet    Refill:  0    Order Specific Question:  Supervising Provider    Answer:  Merlyn Albert [[id number]]   Stop Wellbutrin. Start Zoloft as directed. Warning signs reviewed. DC med and call if any problems. Strongly recommend continue followup with counselor. Recheck in 3 weeks, call back sooner if any problems. Also recommend followup with her gynecologist to discuss possible hormonal issues contributing to her symptoms.

## 2013-01-03 NOTE — Assessment & Plan Note (Signed)
eneralized anxiety disorder  Obsessive compulsive disorder  Panic attacks  Muscle spasms of head and/or neck  Plan: Meds ordered this encounter  Medications  . DISCONTD: ALPRAZolam (XANAX) 0.5 MG tablet    Sig: Take 1 mg by mouth. One po BID prn anxiety  . tiZANidine (ZANAFLEX) 4 MG capsule    Sig: Take 1 capsule (4 mg total) by mouth 3 (three) times daily as needed for muscle spasms.    Dispense:  30 capsule    Refill:  0    Order Specific Question:  Supervising Provider    Answer:  LUKING, WILLIAM S [[id number]]  . sertraline (ZOLOFT) 50 MG tablet    Sig: 1/2 pill po qd x 6 d then one po qd    Dispense:  30 tablet    Refill:  0    Order Specific Question:  Supervising Provider    Answer:  LUKING, WILLIAM S [[id number]]  . ALPRAZolam (XANAX) 1 MG tablet    Sig: Take 1 tablet (1 mg total) by mouth 2 (two) times daily as needed for anxiety.    Dispense:  30 tablet    Refill:  0    Order Specific Question:  Supervising Provider    Answer:  LUKING, WILLIAM S [[id number]]   Stop Wellbutrin. Start Zoloft as directed. Warning signs reviewed. DC med and call if any problems. Strongly recommend continue followup with counselor. Recheck in 3 weeks, call back sooner if any problems. Also recommend followup with her gynecologist to discuss possible hormonal issues contributing to her symptoms.  

## 2013-01-03 NOTE — Progress Notes (Signed)
Subjective:  Presents for complaints of flareup of her anxiety. 5 days ago had to leave work due to a severe anxiety attack, her boss had to take her home. Unable to drive. No specific trigger. Has begun seeing a mental health counselor once a week, states this is going well. No relief with Wellbutrin. Has not identified any specific cause but noticed the symptoms became severe after the birth of her last child who is still an infant. Has noticed some OCD symptoms including some hoarding. Panic attacks. Hypersomnia. Emotional lability, crying for about 2 hours the other night nonstop. Energy level lower than usual. Has decreased her smoking to 2 packs per week. Denies any alcohol or drug use. Having regular menstrual cycles lasting 3-4 days with heavy flow. Gets regular preventive health physicals with her gynecologist at [facility]. Denies any suicidal or homicidal thoughts or ideation. Also significant muscle spasms of the upper back or neck area with her anxiety.  Objective:   BP 122/77  Pulse 80  Wt 191 lb (86.637 kg)  BMI 27.81 kg/m2  LMP 12/01/2012  Breastfeeding? No NAD. Alert, oriented. Thoughts logical coherent and relevant. Dressed appropriately. Mildly anxious affect. Crying at times during office visit. Lungs clear. Heart regular rate rhythm. Tight tender muscles noted all along the upper back area including the trapezius and cervical area, also lateral part of the neck.  Assessment:Generalized anxiety disorder  Obsessive compulsive disorder  Panic attacks  Muscle spasms of head and/or neck  Plan: Meds ordered this encounter  Medications  . DISCONTD: ALPRAZolam (XANAX) 0.5 MG tablet    Sig: Take 1 mg by mouth. One po BID prn anxiety  . tiZANidine (ZANAFLEX) 4 MG capsule    Sig: Take 1 capsule (4 mg total) by mouth 3 (three) times daily as needed for muscle spasms.    Dispense:  30 capsule    Refill:  0    Order Specific Question:  Supervising Provider    Answer:   Merlyn Albert [[id number]]  . sertraline (ZOLOFT) 50 MG tablet    Sig: 1/2 pill po qd x 6 d then one po qd    Dispense:  30 tablet    Refill:  0    Order Specific Question:  Supervising Provider    Answer:  Merlyn Albert [[id number]]  . ALPRAZolam (XANAX) 1 MG tablet    Sig: Take 1 tablet (1 mg total) by mouth 2 (two) times daily as needed for anxiety.    Dispense:  30 tablet    Refill:  0    Order Specific Question:  Supervising Provider    Answer:  Merlyn Albert [[id number]]   Stop Wellbutrin. Start Zoloft as directed. Warning signs reviewed. DC med and call if any problems. Strongly recommend continue followup with counselor. Recheck in 3 weeks, call back sooner if any problems. Also recommend followup with her gynecologist to discuss possible hormonal issues contributing to her symptoms.

## 2013-01-19 ENCOUNTER — Encounter: Payer: Self-pay | Admitting: *Deleted

## 2013-01-20 ENCOUNTER — Other Ambulatory Visit: Payer: PRIVATE HEALTH INSURANCE | Admitting: Adult Health

## 2013-01-21 ENCOUNTER — Encounter: Payer: Self-pay | Admitting: Nurse Practitioner

## 2013-01-21 ENCOUNTER — Ambulatory Visit (INDEPENDENT_AMBULATORY_CARE_PROVIDER_SITE_OTHER): Payer: PRIVATE HEALTH INSURANCE | Admitting: Nurse Practitioner

## 2013-01-21 ENCOUNTER — Encounter: Payer: Self-pay | Admitting: Family Medicine

## 2013-01-21 VITALS — BP 102/68 | HR 70 | Wt 189.0 lb

## 2013-01-21 DIAGNOSIS — F429 Obsessive-compulsive disorder, unspecified: Secondary | ICD-10-CM

## 2013-01-21 DIAGNOSIS — F411 Generalized anxiety disorder: Secondary | ICD-10-CM

## 2013-01-21 MED ORDER — ALPRAZOLAM 1 MG PO TABS: 1.0000 mg | ORAL_TABLET | Freq: Two times a day (BID) | ORAL | Status: DC | PRN

## 2013-01-21 MED ORDER — SERTRALINE HCL 50 MG PO TABS: ORAL_TABLET | ORAL | Status: DC

## 2013-01-25 ENCOUNTER — Encounter: Payer: Self-pay | Admitting: Nurse Practitioner

## 2013-01-25 NOTE — Progress Notes (Signed)
Subjective:  Presents for followup see previous note. Doing well on Zoloft 50 mg daily. Much calmer. Has seen at least 50% improvement in her previous symptoms. Less obsessive-compulsive tendencies. Is working with her counselor as well. Denies any significant adverse effects. Using a half of the Xanax only on a rare basis. Denies suicidal or homicidal thoughts or ideation.  Objective:   BP 102/68  Pulse 70  Wt 189 lb (85.73 kg)  BMI 27.52 kg/m2  LMP 12/01/2012 NAD. Alert, oriented. Calm cheerful affect. Lungs clear. Heart regular rate rhythm.  Assessment:Generalized anxiety disorder  Obsessive compulsive disorder   Plan: Increase Zoloft 50 mg to one and a half tabs by mouth daily for the next couple of weeks then if tolerated and needed increase to 2 tabs by mouth daily. Call back to the office at that time let us know what dose she has titrated to. If any problems, go back to previous dose and let us know. Otherwise followup in 3 months.

## 2013-01-25 NOTE — Assessment & Plan Note (Signed)
Increase Zoloft 50 mg to one and a half tabs by mouth daily for the next couple of weeks then if tolerated and needed increase to 2 tabs by mouth daily. Call back to the office at that time let us know what dose she has titrated to. If any problems, go back to previous dose and let us know. Otherwise followup in 3 months.

## 2013-01-25 NOTE — Assessment & Plan Note (Signed)
Increase Zoloft 50 mg to one and a half tabs by mouth daily for the next couple of weeks then if tolerated and needed increase to 2 tabs by mouth daily. Call back to the office at that time let us know what dose she has titrated to. If any problems, go back to previous dose and let us know. Otherwise followup in 3 months.  

## 2013-04-18 ENCOUNTER — Telehealth: Payer: Self-pay | Admitting: Family Medicine

## 2013-04-18 MED ORDER — SERTRALINE HCL 50 MG PO TABS: 100.0000 mg | ORAL_TABLET | Freq: Every day | ORAL | Status: DC

## 2013-04-18 NOTE — Telephone Encounter (Signed)
Ok plus three ref 

## 2013-04-18 NOTE — Telephone Encounter (Signed)
Last office visit 01/21/13

## 2013-04-18 NOTE — Telephone Encounter (Signed)
Left message on voicemail notifying patient that medication was sent in to pharmacy. 

## 2013-04-18 NOTE — Telephone Encounter (Signed)
Patient needs a refill of her zoloft to [facility] in [place]

## 2013-04-22 ENCOUNTER — Encounter: Payer: Self-pay | Admitting: Family Medicine

## 2013-04-26 ENCOUNTER — Encounter: Payer: Self-pay | Admitting: Family Medicine

## 2013-05-02 ENCOUNTER — Encounter: Payer: Self-pay | Admitting: Nurse Practitioner

## 2013-05-02 ENCOUNTER — Ambulatory Visit: Payer: PRIVATE HEALTH INSURANCE | Admitting: Nurse Practitioner

## 2013-05-02 ENCOUNTER — Ambulatory Visit (INDEPENDENT_AMBULATORY_CARE_PROVIDER_SITE_OTHER): Payer: PRIVATE HEALTH INSURANCE | Admitting: Nurse Practitioner

## 2013-05-02 VITALS — BP 110/74 | Ht 69.5 in | Wt 189.0 lb

## 2013-05-02 DIAGNOSIS — F411 Generalized anxiety disorder: Secondary | ICD-10-CM

## 2013-05-02 MED ORDER — SERTRALINE HCL 100 MG PO TABS: ORAL_TABLET | ORAL | Status: DC

## 2013-05-02 MED ORDER — ALPRAZOLAM 1 MG PO TABS: 1.0000 mg | ORAL_TABLET | Freq: Two times a day (BID) | ORAL | Status: DC | PRN

## 2013-05-05 ENCOUNTER — Encounter: Payer: Self-pay | Admitting: Nurse Practitioner

## 2013-05-05 NOTE — Progress Notes (Signed)
Subjective:  Presents for routine followup of her anxiety. Has had some increased anxiety lately. No difficulty sleeping but has limited time available for sleep, averaging 5-6 hours. Tolerating Zoloft well would like to increase dose.  Objective:   BP 110/74  Ht 5' 9.5" (1.765 m)  Wt 189 lb (85.73 kg)  BMI 27.52 kg/m2 NAD. Alert, oriented. Cheerful affect. Lungs clear. Heart regular rate rhythm.  Assessment:.Generalized anxiety disorder  Plan: Meds ordered this encounter  Medications  . ALPRAZolam (XANAX) 1 MG tablet    Sig: Take 1 tablet (1 mg total) by mouth 2 (two) times daily as needed for anxiety.    Dispense:  30 tablet    Refill:  2    Order Specific Question:  Supervising Provider    Answer:  Merlyn Albert [[id number]]  . sertraline (ZOLOFT) 100 MG tablet    Sig: 1 1/2 tabs po qd    Dispense:  45 tablet    Refill:  5    Order Specific Question:  Supervising Provider    Answer:  Merlyn Albert [[id number]]   Discussed importance of adequate rest. Call back in 2-3 weeks if no improvement. Otherwise routine followup in 3 months.

## 2013-05-11 ENCOUNTER — Encounter (HOSPITAL_COMMUNITY): Payer: Self-pay

## 2013-05-11 ENCOUNTER — Emergency Department (HOSPITAL_COMMUNITY): Payer: PRIVATE HEALTH INSURANCE

## 2013-05-11 ENCOUNTER — Emergency Department (HOSPITAL_COMMUNITY)
Admission: EM | Admit: 2013-05-11 | Discharge: 2013-05-12 | Disposition: A | Discharge status: Home / Self Care | Payer: PRIVATE HEALTH INSURANCE | Attending: Emergency Medicine | Admitting: Emergency Medicine

## 2013-05-11 DIAGNOSIS — R109 Unspecified abdominal pain: Secondary | ICD-10-CM

## 2013-05-11 DIAGNOSIS — N9489 Other specified conditions associated with female genital organs and menstrual cycle: Secondary | ICD-10-CM | POA: Insufficient documentation

## 2013-05-11 DIAGNOSIS — R1031 Right lower quadrant pain: Secondary | ICD-10-CM | POA: Insufficient documentation

## 2013-05-11 DIAGNOSIS — F329 Major depressive disorder, single episode, unspecified: Secondary | ICD-10-CM | POA: Insufficient documentation

## 2013-05-11 DIAGNOSIS — F17200 Nicotine dependence, unspecified, uncomplicated: Secondary | ICD-10-CM | POA: Insufficient documentation

## 2013-05-11 DIAGNOSIS — R112 Nausea with vomiting, unspecified: Secondary | ICD-10-CM | POA: Insufficient documentation

## 2013-05-11 DIAGNOSIS — K5900 Constipation, unspecified: Secondary | ICD-10-CM | POA: Insufficient documentation

## 2013-05-11 DIAGNOSIS — Z8742 Personal history of other diseases of the female genital tract: Secondary | ICD-10-CM | POA: Insufficient documentation

## 2013-05-11 DIAGNOSIS — Z79899 Other long term (current) drug therapy: Secondary | ICD-10-CM | POA: Insufficient documentation

## 2013-05-11 DIAGNOSIS — Z3202 Encounter for pregnancy test, result negative: Secondary | ICD-10-CM | POA: Insufficient documentation

## 2013-05-11 DIAGNOSIS — F3289 Other specified depressive episodes: Secondary | ICD-10-CM | POA: Insufficient documentation

## 2013-05-11 DIAGNOSIS — N858 Other specified noninflammatory disorders of uterus: Secondary | ICD-10-CM

## 2013-05-11 DIAGNOSIS — R630 Anorexia: Secondary | ICD-10-CM | POA: Insufficient documentation

## 2013-05-11 DIAGNOSIS — R509 Fever, unspecified: Secondary | ICD-10-CM | POA: Insufficient documentation

## 2013-05-11 LAB — CBC WITH DIFFERENTIAL/PLATELET
Eosinophils Relative: 2 % (ref 0–5)
Lymphocytes Relative: 37 % (ref 12–46)
Lymphs Abs: 2.2 10*3/uL (ref 0.7–4.0)
MCH: 28.7 pg (ref 26.0–34.0)
MCHC: 33.6 g/dL (ref 30.0–36.0)
Monocytes Absolute: 0.5 10*3/uL (ref 0.1–1.0)
Monocytes Relative: 9 % (ref 3–12)
Neutro Abs: 3.1 10*3/uL (ref 1.7–7.7)
Neutrophils Relative %: 52 % (ref 43–77)
Platelets: 172 10*3/uL (ref 150–400)

## 2013-05-11 LAB — COMPREHENSIVE METABOLIC PANEL
Albumin: 3.9 g/dL (ref 3.5–5.2)
BUN: 9 mg/dL (ref 6–23)
CO2: 25 mEq/L (ref 19–32)
Calcium: 9.2 mg/dL (ref 8.4–10.5)
Chloride: 106 mEq/L (ref 96–112)
Creatinine, Ser: 0.74 mg/dL (ref 0.50–1.10)
GFR calc Af Amer: >90 mL/min (ref >90)
GFR calc non Af Amer: >90 mL/min (ref >90)
Glucose, Bld: 95 mg/dL (ref 70–99)
Total Bilirubin: 0.4 mg/dL (ref 0.3–1.2)

## 2013-05-11 LAB — URINALYSIS, ROUTINE W REFLEX MICROSCOPIC
Bilirubin Urine: NEGATIVE
Glucose, UA: NEGATIVE mg/dL
Ketones, ur: NEGATIVE mg/dL
Leukocytes, UA: NEGATIVE
pH: 6 (ref 5.0–8.0)

## 2013-05-11 MED ORDER — ONDANSETRON HCL 4 MG/2ML IJ SOLN
4.0000 mg | Freq: Once | INTRAMUSCULAR | Status: AC
  Administered 2013-05-11: 4 mg via INTRAVENOUS
  Filled 2013-05-11: qty 2

## 2013-05-11 MED ORDER — IOHEXOL 300 MG/ML  SOLN
100.0000 mL | Freq: Once | INTRAMUSCULAR | Status: AC | PRN
  Administered 2013-05-11: 100 mL via INTRAVENOUS

## 2013-05-11 MED ORDER — MORPHINE SULFATE 4 MG/ML IJ SOLN
4.0000 mg | Freq: Once | INTRAMUSCULAR | Status: AC
  Administered 2013-05-11: 4 mg via INTRAVENOUS
  Filled 2013-05-11: qty 1

## 2013-05-11 MED ORDER — IOHEXOL 300 MG/ML  SOLN
50.0000 mL | Freq: Once | INTRAMUSCULAR | Status: AC | PRN
  Administered 2013-05-11: 50 mL via ORAL

## 2013-05-11 NOTE — ED Notes (Signed)
Pt presents with c/o constant nausea and upper rt abdominal pain x 4 days. Emesis today, denies diarrhea and fever.  No prior history of kidney stones.  Pt denies OTC medication for nausea. NAD noted at this time.

## 2013-05-11 NOTE — ED Notes (Signed)
Pt reports right lower quad ab pain off/on for 5 days, pain has become more severe and constant to the area. +nausea, no vomiting, low grade fever at home.  Diarrhea sat.and Sunday, has been "real gassy"

## 2013-05-12 ENCOUNTER — Ambulatory Visit (INDEPENDENT_AMBULATORY_CARE_PROVIDER_SITE_OTHER): Payer: PRIVATE HEALTH INSURANCE | Admitting: Obstetrics & Gynecology

## 2013-05-12 ENCOUNTER — Encounter: Payer: Self-pay | Admitting: Obstetrics & Gynecology

## 2013-05-12 VITALS — BP 96/68 | Ht 69.5 in | Wt 190.0 lb

## 2013-05-12 DIAGNOSIS — N840 Polyp of corpus uteri: Secondary | ICD-10-CM | POA: Insufficient documentation

## 2013-05-12 DIAGNOSIS — N920 Excessive and frequent menstruation with regular cycle: Secondary | ICD-10-CM | POA: Insufficient documentation

## 2013-05-12 MED ORDER — CYCLOBENZAPRINE HCL 10 MG PO TABS: 10.0000 mg | ORAL_TABLET | Freq: Three times a day (TID) | ORAL | Status: DC | PRN

## 2013-05-12 MED ORDER — OXYCODONE-ACETAMINOPHEN 5-325 MG PO TABS
ORAL_TABLET | ORAL | Status: AC
  Filled 2013-05-12: qty 1

## 2013-05-12 MED ORDER — HYDROCODONE-ACETAMINOPHEN 5-325 MG PO TABS
1.0000 | ORAL_TABLET | Freq: Once | ORAL | Status: DC
  Filled 2013-05-12: qty 1

## 2013-05-12 MED ORDER — MEGESTROL ACETATE 40 MG PO TABS: 40.0000 mg | ORAL_TABLET | Freq: Every day | ORAL | Status: DC

## 2013-05-12 MED ORDER — OXYCODONE-ACETAMINOPHEN 5-325 MG PO TABS
1.0000 | ORAL_TABLET | Freq: Once | ORAL | Status: AC
  Administered 2013-05-12: 1 via ORAL

## 2013-05-12 MED ORDER — OXYCODONE-ACETAMINOPHEN 5-325 MG PO TABS: 1.0000 | ORAL_TABLET | ORAL | Status: DC | PRN

## 2013-05-12 MED ORDER — ONDANSETRON HCL 8 MG PO TABS: 8.0000 mg | ORAL_TABLET | Freq: Three times a day (TID) | ORAL | Status: DC | PRN

## 2013-05-12 MED ORDER — HYDROCODONE-ACETAMINOPHEN 5-325 MG PO TABS: 1.0000 | ORAL_TABLET | ORAL | Status: DC | PRN

## 2013-05-12 NOTE — Progress Notes (Signed)
Patient ID: KAYLIN MARCON, female   DOB: 07-09-1976, 37 y.o.   MRN: [id number] SUHAYLA CHISOM This in as a referral from the emergency room  She's had about a week of right lower quadrant pain and nausea but no vomiting normal bowel movement She had a CT scan which reveals normal appendix no kidney stone no abnormalities except endometrial mass it looks like a polyp versus fibroid about 2 cm Additionally her periods are "" horrible she resource to using her daughter's diapers because of bleeding She does have trigger point along her right paraspinous consistent with muscle spasm Many different Flexeril for that Am going to see her back tomorrow morning for a sonogram for better evaluation of her endometrium and adnexa Probably make plans for an endometrial ablation and hysteroscopy with removal of mass next week  I don't think the right lower quadrant and right back pain her in any way related to the endometrial mass The patient is aware that The Flexeril give her some relief  We will see her back tomorrow morning for sonogram

## 2013-05-13 ENCOUNTER — Encounter (HOSPITAL_COMMUNITY)
Admission: RE | Admit: 2013-05-13 | Discharge: 2013-05-13 | Disposition: A | Discharge status: Home / Self Care | Payer: PRIVATE HEALTH INSURANCE | Source: Ambulatory Visit | Attending: Obstetrics & Gynecology | Admitting: Obstetrics & Gynecology

## 2013-05-13 ENCOUNTER — Other Ambulatory Visit: Payer: Self-pay | Admitting: Obstetrics & Gynecology

## 2013-05-13 ENCOUNTER — Encounter (HOSPITAL_COMMUNITY): Payer: Self-pay

## 2013-05-13 ENCOUNTER — Encounter: Payer: Self-pay | Admitting: Obstetrics & Gynecology

## 2013-05-13 ENCOUNTER — Ambulatory Visit (INDEPENDENT_AMBULATORY_CARE_PROVIDER_SITE_OTHER): Payer: PRIVATE HEALTH INSURANCE

## 2013-05-13 ENCOUNTER — Ambulatory Visit (INDEPENDENT_AMBULATORY_CARE_PROVIDER_SITE_OTHER): Payer: PRIVATE HEALTH INSURANCE | Admitting: Obstetrics & Gynecology

## 2013-05-13 ENCOUNTER — Encounter (HOSPITAL_COMMUNITY): Payer: Self-pay | Admitting: Pharmacy Technician

## 2013-05-13 DIAGNOSIS — M545 Low back pain, unspecified: Secondary | ICD-10-CM

## 2013-05-13 DIAGNOSIS — N920 Excessive and frequent menstruation with regular cycle: Secondary | ICD-10-CM

## 2013-05-13 DIAGNOSIS — N840 Polyp of corpus uteri: Secondary | ICD-10-CM

## 2013-05-13 DIAGNOSIS — N9489 Other specified conditions associated with female genital organs and menstrual cycle: Secondary | ICD-10-CM

## 2013-05-13 DIAGNOSIS — Z01818 Encounter for other preprocedural examination: Secondary | ICD-10-CM | POA: Insufficient documentation

## 2013-05-13 DIAGNOSIS — Z01812 Encounter for preprocedural laboratory examination: Secondary | ICD-10-CM | POA: Insufficient documentation

## 2013-05-13 NOTE — Patient Instructions (Signed)
Emma Johnson  05/13/2013   Your procedure is scheduled on:  05/18/2013 Report to [facility] at  850  AM.  Call this number if you have problems the morning of surgery: [phone]   Remember:   Do not eat food or drink liquids after midnight.   Take these medicines the morning of surgery with A SIP OF WATER: xanax, flexaril, percocet,zoloft   Do not wear jewelry, make-up or nail polish.  Do not wear lotions, powders, or perfumes.   Do not shave 48 hours prior to surgery. Men may shave face and neck.  Do not bring valuables to the hospital.  [facility] is not responsible  for any belongings or valuables.               Contacts, dentures or bridgework may not be worn into surgery.  Leave suitcase in the car. After surgery it may be brought to your room.  For patients admitted to the hospital, discharge time is determined by your  treatment team.               Patients discharged the day of surgery will not be allowed to drive home.  Name and phone number of your driver: family  Special Instructions: Shower using CHG 2 nights before surgery and the night before surgery.  If you shower the day of surgery use CHG.  Use special wash - you have one bottle of CHG for all showers.  You should use approximately 1/3 of the bottle for each shower.   Please read over the following fact sheets that you were given: Pain Booklet, Coughing and Deep Breathing, Surgical Site Infection Prevention, Anesthesia Post-op Instructions and Care and Recovery After Surgery Dilation and Curettage or Vacuum Curettage Dilation and curettage (D&C) and vacuum curettage are minor procedures. A D&C involves stretching (dilation) the cervix and scraping (curettage) the inside lining of the womb (uterus). During a D&C, tissue is gently scraped from the inside lining of the uterus. During a vacuum curettage, the lining and tissue in the uterus are removed with the use of gentle suction. Curettage may be  performed for diagnostic or therapeutic purposes. As a diagnostic procedure, curettage is performed for the purpose of examining tissues from the uterus. Tissue examination may help determine causes or treatment options for symptoms. A diagnostic curettage may be performed for the following symptoms:  Irregular bleeding in the uterus.  Bleeding with the development of clots.  Spotting between menstrual periods.  Prolonged menstrual periods.  Bleeding after menopause.  No menstrual period (amenorrhea).  A change in size and shape of the uterus. A therapeutic curettage is performed to remove tissue, blood, or a contraceptive device. Therapeutic curettage may be performed for the following conditions:   Removal of an IUD (intrauterine device).  Removal of retained placenta after giving birth. Retained placenta can cause bleeding severe enough to require transfusions or an infection.  Abortion.  Miscarriage.  Removal of polyps inside the uterus.  Removal of uncommon types of fibroids (noncancerous lumps). LET YOUR CAREGIVER KNOW ABOUT:   Allergies to food or medicine.  Medicines taken, including vitamins, herbs, eyedrops, over-the-counter medicines, and creams.  Use of steroids (by mouth or creams).  Previous problems with anesthetics or numbing medicines.  History of bleeding problems or blood clots.  Previous surgery.  Other health problems, including diabetes and kidney problems.  Possibility of pregnancy, if this applies. RISKS AND COMPLICATIONS   Excessive  bleeding.  Infection of the uterus.  Damage to the cervix.  Development of scar tissue (adhesions) inside the uterus, later causing abnormal amounts of menstrual bleeding.  Complications from the general anesthetic, if a general anesthetic is used.  Putting a hole (perforation) in the uterus. This is rare. BEFORE THE PROCEDURE   Eat and drink before the procedure only as directed by your  caregiver.  Arrange for someone to take you home. PROCEDURE   This procedure may be done in a hospital, outpatient clinic, or caregiver's office.  You may be given a general anesthetic or a local anesthetic in and around the cervix.  You will lie on your back with your legs in stirrups.  There are two ways in which your cervix can be softened and dilated. These include:  Taking a medicine.  Having thin rods (laminaria) inserted into your cervix.  A curved tool (curette) will scrape cells from the inside lining of the uterus and will then be removed. This procedure usually takes about 15 to 30 minutes. AFTER THE PROCEDURE   You will rest in the recovery area until you are stable and are ready to go home.  You will need to have someone take you home.  You may feel sick to your stomach (nauseous) or throw up (vomit) if you had general anesthesia.  You may have a sore throat if a tube was placed in your throat during general anesthesia.  You may have light cramping and bleeding for 2 days to 2 weeks after the procedure.  Your uterus needs to make a new lining after the procedure. This may make your next period late. Document Released: 07/28/2005 Document Revised: 10/20/2011 Document Reviewed: 02/23/2009 [facility] Patient Information 2014 [place], [place]. Endometrial Ablation Endometrial ablation removes the lining of the uterus (endometrium). It is usually a same day, outpatient treatment. Ablation helps avoid major surgery (such as a hysterectomy). A hysterectomy is removal of the cervix and uterus. Endometrial ablation has less risk and complications, has a shorter recovery period and is less expensive. After endometrial ablation, most women will have little or no menstrual bleeding. You may not keep your fertility. Pregnancy is no longer likely after this procedure but if you are pre-menopausal, you still need to use a reliable method of birth control following the procedure because  pregnancy can occur. REASONS TO HAVE THE PROCEDURE MAY INCLUDE:  Heavy periods.  Bleeding that is causing anemia.  Anovulatory bleeding, very irregular, bleeding.  Bleeding submucous fibroids (on the lining inside the uterus) if they are smaller than 3 centimeters. REASONS NOT TO HAVE THE PROCEDURE MAY INCLUDE:  You wish to have more children.  You have a pre-cancerous or cancerous problem. The cause of any abnormal bleeding must be diagnosed before having the procedure.  You have pain coming from the uterus.  You have a submucus fibroid larger than 3 centimeters.  You recently had a baby.  You recently had an infection in the uterus.  You have a severe retro-flexed, tipped uterus and cannot insert the instrument to do the ablation.  You had a Cesarean section or deep major surgery on the uterus.  The inner cavity of the uterus is too large for the endometrial ablation instrument. RISKS AND COMPLICATIONS   Perforation of the uterus.  Bleeding.  Infection of the uterus, bladder or vagina.  Injury to surrounding organs.  Cutting the cervix.  An air bubble to the lung (air embolus).  Pregnancy following the procedure.  Failure of the  procedure to help the problem requiring hysterectomy.  Decreased ability to diagnose cancer in the lining of the uterus. BEFORE THE PROCEDURE  The lining of the uterus must be tested to make sure there is no pre-cancerous or cancer cells present.  Medications may be given to make the lining of the uterus thinner.  Ultrasound may be used to evaluate the size and look for abnormalities of the uterus.  Future pregnancy is not desired. PROCEDURE  There are different ways to destroy the lining of the uterus.   Resectoscope - radio frequency-alternating electric current is the most common one used.  Cryotherapy - freezing the lining of the uterus.  Heated Free Liquid - heated salt (saline) solution inserted into the  uterus.  Microwave - uses high energy microwaves in the uterus.  Thermal Balloon - a catheter with a balloon tip is inserted into the uterus and filled with heated fluid. Your caregiver will talk with you about the method used in this clinic. They will also instruct you on the pros and cons of the procedure. Endometrial ablation is performed along with a procedure called operative hysteroscopy. A narrow viewing tube is inserted through the birth canal (vagina) and through the cervix into the uterus. A tiny camera attached to the viewing tube (hysteroscope) allows the uterine cavity to be shown on a TV monitor during surgery. Your uterus is filled with a harmless liquid to make the procedure easier. The lining of the uterus is then removed. The lining can also be removed with a resectoscope which allows your surgeon to cut away the lining of the uterus under direct vision. Usually, you will be able to go home within an hour after the procedure. HOME CARE INSTRUCTIONS   Do not drive for 24 hours.  No tampons, douching or intercourse for 2 weeks or until your caregiver approves.  Rest at home for 24 to 48 hours. You may then resume normal activities unless told differently by your caregiver.  Take your temperature two times a day for 4 days, and record it.  Take any medications your caregiver has ordered, as directed.  Use some form of contraception if you are pre-menopausal and do not want to get pregnant. Bleeding after the procedure is normal. It varies from light spotting and mildly watery to bloody discharge for 4 to 6 weeks. You may also have mild cramping. Only take over-the-counter or prescription medicines for pain, discomfort, or fever as directed by your caregiver. Do not use aspirin, as this may aggravate bleeding. Frequent urination during the first 24 hours is normal. You will not know how effective your surgery is until at least 3 months after the surgery. SEEK IMMEDIATE MEDICAL CARE  IF:   Bleeding is heavier than a normal menstrual cycle.  An oral temperature above 102 F (38.9 C) develops.  You have increasing cramps or pains not relieved with medication or develop belly (abdominal) pain which does not seem to be related to the same area of earlier cramping and pain.  You are light headed, weak or have fainting episodes.  You develop pain in the shoulder strap areas.  You have chest or leg pain.  You have abnormal vaginal discharge.  You have painful urination. Document Released: 06/06/2004 Document Revised: 10/20/2011 Document Reviewed: 09/04/2007 [facility] Patient Information 2014 [place], [place]. Hysteroscopy Hysteroscopy is a procedure used for looking inside the womb (uterus). It may be done for many different reasons, including:  To evaluate abnormal bleeding, fibroid (benign, noncancerous) tumors, polyps,  scar tissue (adhesions), and possibly cancer of the uterus.  To look for lumps (tumors) and other uterine growths.  To look for causes of why a woman cannot get pregnant (infertility), causes of recurrent loss of pregnancy (miscarriages), or a lost intrauterine device (IUD).  To perform a sterilization by blocking the fallopian tubes from inside the uterus. A hysteroscopy should be done right after a menstrual period to be sure you are not pregnant. LET YOUR CAREGIVER KNOW ABOUT:   Allergies.  Medicines taken, including herbs, eyedrops, over-the-counter medicines, and creams.  Use of steroids (by mouth or creams).  Previous problems with anesthetics or numbing medicines.  History of bleeding or blood problems.  History of blood clots.  Possibility of pregnancy, if this applies.  Previous surgery.  Other health problems. RISKS AND COMPLICATIONS   Putting a hole in the uterus.  Excessive bleeding.  Infection.  Damage to the cervix.  Injury to other organs.  Allergic reaction to medicines.  Too much fluid used in the uterus  for the procedure. BEFORE THE PROCEDURE   Do not take aspirin or blood thinners for a week before the procedure, or as directed. It can cause bleeding.  Arrive at least 60 minutes before the procedure or as directed to read and sign the necessary forms.  Arrange for someone to take you home after the procedure.  If you smoke, do not smoke for 2 weeks before the procedure. PROCEDURE   Your caregiver may give you medicine to relax you. He or she may also give you a medicine that numbs the area around the cervix (local anesthetic) or a medicine that makes you sleep (general anesthesia).  Sometimes, a medicine is placed in the cervix the day before the procedure. This medicine makes the cervix have a larger opening (dilate). This makes it easier for the instrument to be inserted into the uterus.  A small instrument (hysteroscope) is inserted through the vagina into the uterus. This instrument is similar to a pencil-sized telescope with a light.  During the procedure, air or a liquid is put into the uterus, which allows the surgeon to see better.  Sometimes, tissue is gently scraped from inside the uterus. These tissue samples are sent to a specialist who looks at tissue samples (pathologist). The pathologist will give a report to your caregiver. This will help your caregiver decide if further treatment is necessary. The report will also help your caregiver decide on the best treatment if the test comes back abnormal. AFTER THE PROCEDURE   If you had a general anesthetic, you may be groggy for a couple hours after the procedure.  If you had a local anesthetic, you will be advised to rest at the surgical center or caregiver's office until you are stable and feel ready to go home.  You may have some cramping for a couple days.  You may have bleeding, which varies from light spotting for a few days to menstrual-like bleeding for up to 3 to 7 days. This is normal.  Have someone take you  home. FINDING OUT THE RESULTS OF YOUR TEST Not all test results are available during your visit. If your test results are not back during the visit, make an appointment with your caregiver to find out the results. Do not assume everything is normal if you have not heard from your caregiver or the medical facility. It is important for you to follow up on all of your test results. HOME CARE INSTRUCTIONS   Do  not drive for 24 hours or as instructed.  Only take over-the-counter or prescription medicines for pain, discomfort, or fever as directed by your caregiver.  Do not take aspirin. It can cause or aggravate bleeding.  Do not drive or drink alcohol while taking pain medicine.  You may resume your usual diet.  Do not use tampons, douche, or have sexual intercourse for 2 weeks, or as advised by your caregiver.  Rest and sleep for the first 24 to 48 hours.  Take your temperature twice a day for 4 to 5 days. Write it down. Give these temperatures to your caregiver if they are abnormal (above 98.6 F or 37.0 C).  Take medicines your caregiver has ordered as directed.  Follow your caregiver's advice regarding diet, exercise, lifting, driving, and general activities.  Take showers instead of baths for 2 weeks, or as recommended by your caregiver.  If you develop constipation:  Take a mild laxative with the advice of your caregiver.  Eat bran foods.  Drink enough water and fluids to keep your urine clear or pale yellow.  Try to have someone with you or available to you for the first 24 to 48 hours, especially if you had a general anesthetic.  Make sure you and your family understand everything about your operation and recovery.  Follow your caregiver's advice regarding follow-up appointments and Pap smears. SEEK MEDICAL CARE IF:   You feel dizzy or lightheaded.  You feel sick to your stomach (nauseous).  You develop abnormal vaginal discharge.  You develop a rash.  You have  an abnormal reaction or allergy to your medicine.  You need stronger pain medicine. SEEK IMMEDIATE MEDICAL CARE IF:   Bleeding is heavier than a normal menstrual period or you have blood clots.  You have an oral temperature above 102 F (38.9 C), not controlled by medicine.  You have increasing cramps or pains not relieved with medicine.  You develop belly (abdominal) pain that does not seem to be related to the same area of earlier cramping and pain.  You pass out.  You develop pain in the tops of your shoulders (shoulder strap areas).  You develop shortness of breath. MAKE SURE YOU:   Understand these instructions.  Will watch your condition.  Will get help right away if you are not doing well or get worse. Document Released: 11/03/2000 Document Revised: 10/20/2011 Document Reviewed: 02/26/2009 [facility] Patient Information 2014 [place], [place]. PATIENT INSTRUCTIONS POST-ANESTHESIA  IMMEDIATELY FOLLOWING SURGERY:  Do not drive or operate machinery for the first twenty four hours after surgery.  Do not make any important decisions for twenty four hours after surgery or while taking narcotic pain medications or sedatives.  If you develop intractable nausea and vomiting or a severe headache please notify your doctor immediately.  FOLLOW-UP:  Please make an appointment with your surgeon as instructed. You do not need to follow up with anesthesia unless specifically instructed to do so.  WOUND CARE INSTRUCTIONS (if applicable):  Keep a dry clean dressing on the anesthesia/puncture wound site if there is drainage.  Once the wound has quit draining you may leave it open to air.  Generally you should leave the bandage intact for twenty four hours unless there is drainage.  If the epidural site drains for more than 36-48 hours please call the anesthesia department.  QUESTIONS?:  Please feel free to call your physician or the hospital operator if you have any questions, and they will be  happy to assist you.

## 2013-05-13 NOTE — Progress Notes (Signed)
Patient ID: Emma Johnson, female   DOB: Dec 27, 1975, 37 y.o.   MRN: [id number] Preoperative History and Physical  Emma Johnson is a 37 y.o. [id number] with Patient's last menstrual period was 04/27/2013. admitted for a hysteroscopy uterine curettage endometrial ablation and removal of large endometrial polyp.  Pt has been having increasingly heavy and painful menses since birth of her child last year.  CT scan done for an unrelated issue revealed a large 3 cm endometrial mass and a sonogram in the office confirms a polyp.  Fo management and removal  PMH:    Past Medical History  Diagnosis Date  . AMA (advanced maternal age) multigravida 35+   . No pertinent past medical history   . Depression   . Fibroids     PSH:     Past Surgical History  Procedure Laterality Date  . No past surgeries      POb/GynH:      OB History   Grav Para Term Preterm Abortions TAB SAB Ect Mult Living   5 2 2  2 2    2       SH:   History  Substance Use Topics  . Smoking status: Current Some Day Smoker -- 0.30 packs/day    Types: Cigarettes  . Smokeless tobacco: Never Used  . Alcohol Use: No    FH:    Family History  Problem Relation Age of Onset  . Hypertension Father   . Multiple sclerosis Mother   . Cancer Maternal Aunt     uterine and lung  . Cancer Paternal Uncle     leukemia  . Cancer Maternal Grandmother     breast  . Heart disease Paternal Grandmother      Allergies:  Allergies  Allergen Reactions  . Bee Venom     Medications:      Current outpatient prescriptions:ALPRAZolam (XANAX) 1 MG tablet, Take 1 tablet (1 mg total) by mouth 2 (two) times daily as needed for anxiety., Disp: 30 tablet, Rfl: 2;  cyclobenzaprine (FLEXERIL) 10 MG tablet, Take 1 tablet (10 mg total) by mouth 3 (three) times daily as needed for muscle spasms., Disp: 90 tablet, Rfl: 1;  megestrol (MEGACE) 40 MG tablet, Take 1 tablet (40 mg total) by mouth daily., Disp: 60 tablet, Rfl: 1 naproxen sodium  (ALEVE) 220 MG tablet, Take 440 mg by mouth daily as needed (for pain)., Disp: , Rfl: ;  ondansetron (ZOFRAN) 8 MG tablet, Take 1 tablet (8 mg total) by mouth every 8 (eight) hours as needed for nausea., Disp: 12 tablet, Rfl: 0;  oxyCODONE-acetaminophen (PERCOCET/ROXICET) 5-325 MG per tablet, Take 1 tablet by mouth every 4 (four) hours as needed for pain., Disp: 15 tablet, Rfl: 0 sertraline (ZOLOFT) 100 MG tablet, Take 150 mg by mouth daily., Disp: , Rfl:   Review of Systems:   Review of Systems  Constitutional: Negative for fever, chills, weight loss, malaise/fatigue and diaphoresis.  HENT: Negative for hearing loss, ear pain, nosebleeds, congestion, sore throat, neck pain, tinnitus and ear discharge.   Eyes: Negative for blurred vision, double vision, photophobia, pain, discharge and redness.  Respiratory: Negative for cough, hemoptysis, sputum production, shortness of breath, wheezing and stridor.   Cardiovascular: Negative for chest pain, palpitations, orthopnea, claudication, leg swelling and PND.  Gastrointestinal: Positive for abdominal pain. Negative for heartburn, nausea, vomiting, diarrhea, constipation, blood in stool and melena.  Genitourinary: Negative for dysuria, urgency, frequency, hematuria and flank pain.  Musculoskeletal: Negative for myalgias, back pain, joint pain  and falls.  Skin: Negative for itching and rash.  Neurological: Negative for dizziness, tingling, tremors, sensory change, speech change, focal weakness, seizures, loss of consciousness, weakness and headaches.  Endo/Heme/Allergies: Negative for environmental allergies and polydipsia. Does not bruise/bleed easily.  Psychiatric/Behavioral: Negative for depression, suicidal ideas, hallucinations, memory loss and substance abuse. The patient is not nervous/anxious and does not have insomnia.      PHYSICAL EXAM:  Last menstrual period 04/27/2013, not currently breastfeeding.    Vitals reviewed. Constitutional:  She is oriented to person, place, and time. She appears well-developed and well-nourished.  HENT:  Head: Normocephalic and atraumatic.  Right Ear: External ear normal.  Left Ear: External ear normal.  Nose: Nose normal.  Mouth/Throat: Oropharynx is clear and moist.  Eyes: Conjunctivae and EOM are normal. Pupils are equal, round, and reactive to light. Right eye exhibits no discharge. Left eye exhibits no discharge. No scleral icterus.  Neck: Normal range of motion. Neck supple. No tracheal deviation present. No thyromegaly present.  Cardiovascular: Normal rate, regular rhythm, normal heart sounds and intact distal pulses.  Exam reveals no gallop and no friction rub.   No murmur heard. Respiratory: Effort normal and breath sounds normal. No respiratory distress. She has no wheezes. She has no rales. She exhibits no tenderness.  GI: Soft. Bowel sounds are normal. She exhibits no distension and no mass. There is tenderness. There is no rebound and no guarding.  Genitourinary:       Vulva is normal without lesions Vagina is pink moist without discharge Cervix normal in appearance and pap is normal Uterus is normal size shape and contour with large 3 cm endometrial polyp Adnexa is negative with normal sized ovaries by sonogram  Musculoskeletal: Normal range of motion. She exhibits no edema and no tenderness.  Neurological: She is alert and oriented to person, place, and time. She has normal reflexes. She displays normal reflexes. No cranial nerve deficit. She exhibits normal muscle tone. Coordination normal.  Skin: Skin is warm and dry. No rash noted. No erythema. No pallor.  Psychiatric: She has a normal mood and affect. Her behavior is normal. Judgment and thought content normal.    Labs: Results for orders placed during the hospital encounter of 05/11/13 (from the past 336 hour(s))  PREGNANCY, URINE   Collection Time    05/11/13  6:14 PM      Result Value Range   Preg Test, Ur NEGATIVE   NEGATIVE  URINALYSIS, ROUTINE W REFLEX MICROSCOPIC   Collection Time    05/11/13  6:14 PM      Result Value Range   Color, Urine YELLOW  YELLOW   APPearance CLEAR  CLEAR   Specific Gravity, Urine 1.025  1.005 - 1.030   pH 6.0  5.0 - 8.0   Glucose, UA NEGATIVE  NEGATIVE mg/dL   Hgb urine dipstick NEGATIVE  NEGATIVE   Bilirubin Urine NEGATIVE  NEGATIVE   Ketones, ur NEGATIVE  NEGATIVE mg/dL   Protein, ur NEGATIVE  NEGATIVE mg/dL   Urobilinogen, UA 0.2  0.0 - 1.0 mg/dL   Nitrite NEGATIVE  NEGATIVE   Leukocytes, UA NEGATIVE  NEGATIVE  CBC WITH DIFFERENTIAL   Collection Time    05/11/13  9:32 PM      Result Value Range   WBC 6.0  4.0 - 10.5 K/uL   RBC 4.67  3.87 - 5.11 MIL/uL   Hemoglobin 13.4  12.0 - 15.0 g/dL   HCT 40.9  81.1 - 91.4 %   MCV  85.4  78.0 - 100.0 fL   MCH 28.7  26.0 - 34.0 pg   MCHC 33.6  30.0 - 36.0 g/dL   RDW 16.1  09.6 - 04.5 %   Platelets 172  150 - 400 K/uL   Neutrophils Relative % 52  43 - 77 %   Neutro Abs 3.1  1.7 - 7.7 K/uL   Lymphocytes Relative 37  12 - 46 %   Lymphs Abs 2.2  0.7 - 4.0 K/uL   Monocytes Relative 9  3 - 12 %   Monocytes Absolute 0.5  0.1 - 1.0 K/uL   Eosinophils Relative 2  0 - 5 %   Eosinophils Absolute 0.1  0.0 - 0.7 K/uL   Basophils Relative 1  0 - 1 %   Basophils Absolute 0.0  0.0 - 0.1 K/uL  COMPREHENSIVE METABOLIC PANEL   Collection Time    05/11/13  9:32 PM      Result Value Range   Sodium 139  135 - 145 mEq/L   Potassium 3.9  3.5 - 5.1 mEq/L   Chloride 106  96 - 112 mEq/L   CO2 25  19 - 32 mEq/L   Glucose, Bld 95  70 - 99 mg/dL   BUN 9  6 - 23 mg/dL   Creatinine, Ser 4.09  0.50 - 1.10 mg/dL   Calcium 9.2  8.4 - 81.1 mg/dL   Total Protein 7.2  6.0 - 8.3 g/dL   Albumin 3.9  3.5 - 5.2 g/dL   AST 16  0 - 37 U/L   ALT 10  0 - 35 U/L   Alkaline Phosphatase 63  39 - 117 U/L   Total Bilirubin 0.4  0.3 - 1.2 mg/dL   GFR calc non Af Amer >90  >90 mL/min   GFR calc Af Amer >90  >90 mL/min    EKG: No orders found for this  or any previous visit.  Imaging Studies: US Transvaginal Non-ob  2013/05/20   GYNECOLOGIC SONOGRAM   Emma Johnson is a 37 y.o. [id number] LMP 04/27/2013 for a pelvic  sonogram for ?endom mass per CT and menorrhagia.  Uterus                      9.5 x 6.2 x 4.9 cm, retroverted   Endometrium          23.6 mm, asymmetrical, with 3 x 2.5 cm hyperechoic  mass within cavity 3D rendering outlines mass   Right ovary             2.6 x 1.7 x 1.5 cm,   Left ovary                3.2 x 2.6 x 2.0 cm,   No free fluid or adnexal masses noted within pelvis  Technician Comments:  3 x 2.5cm endometrial mass outlined by 3D rendering with +Doppler flow  noted within    Emma Johnson 05-20-2013 9:55 AM     Ct Abdomen Pelvis W Contrast  05/11/2013   CLINICAL DATA:  Right lower quadrant abdominal pain.  EXAM: CT ABDOMEN AND PELVIS WITH CONTRAST  TECHNIQUE: Multidetector CT imaging of the abdomen and pelvis was performed using the standard protocol following bolus administration of intravenous contrast.  CONTRAST:  50mL OMNIPAQUE IOHEXOL 300 MG/ML SOLN, OMNIPAQUE IOHEXOL 300 MG/ML SOLN  COMPARISON:  None.  FINDINGS: The lung bases are clear. No pleural effusion. The heart is normal.  The solid abdominal  organs are normal. The gallbladder is normal. No common bowel duct dilatation.  The stomach, duodenum, small bowel and colon are unremarkable. No inflammatory changes, mass lesions or obstructive findings. No mesenteric or retroperitoneal mass or adenopathy. The aorta is normal. The portal and splenic veins are patent. Both renal veins are patent.  The uterus is abnormal. There appears to be an endometrial lesion. Could not exclude pregnancy. A submucosal fibroid is possible. A large endometrial polyp is also possible. Recommend correlation with pregnancy test. The ovaries are normal. The appendix is normal.  The bony structures are unremarkable.  IMPRESSION: Endometrial lesion. Rule-out pregnancy. Other possibilities include  submucosal fibroid or large endometrial polyp.  No other significant findings.   Electronically Signed   By: Loralie Champagne M.D.   On: 05/11/2013 23:34   [place] [facility]  05/13/2013   GYNECOLOGIC SONOGRAM   Emma Johnson is a 37 y.o. [id number] LMP 04/27/2013 for a pelvic  sonogram for ?endom mass per CT and menorrhagia.  Uterus                      9.5 x 6.2 x 4.9 cm, retroverted   Endometrium          23.6 mm, asymmetrical, with 3 x 2.5 cm hyperechoic  mass within cavity 3D rendering outlines mass   Right ovary             2.6 x 1.7 x 1.5 cm,   Left ovary                3.2 x 2.6 x 2.0 cm,   No free fluid or adnexal masses noted within pelvis  Technician Comments:  3 x 2.5cm endometrial mass outlined by 3D rendering with +Doppler flow  noted within    Emma Johnson 05/13/2013 9:55 AM        Assessment: Endometrial polyp menometrorrhagia Patient Active Problem List   Diagnosis Date Noted  . Excessive or frequent menstruation 05/12/2013  . 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 Obsessive compulsive disorder 01/03/2013  . Generalized anxiety disorder 11/12/2012    Plan: Hysteroscopic removal of endometrial polyp uterine curettage and endometrial ablation using thermachoice  EURE,LUTHER H 05/13/2013 10:41 AM

## 2013-05-13 NOTE — ED Provider Notes (Signed)
CSN: [id number]     Arrival date & time 05/11/13  1801 History   First MD Initiated Contact with Patient 05/11/13 2044     Chief Complaint  Patient presents with  . Abdominal Pain   (Consider location/radiation/quality/duration/timing/severity/associated sxs/prior Treatment) HPI Comments: Emma Johnson is a 37 y.o. Female presenting with a 5 day history of right lower quadrant pain for the past 4 days which has been slowly becoming worsened in intensity.  She describes a constant ache which is worsened with palpation and certain movements,particularly with walking. She has had low grade fevers and nausea which has resulted in emesis x 1 today and has had anorexia today only as well.  She denies diarrhea currently but did have mild symptoms 2 days ago with increased flatulence which resolved.  She also denies dysuria and vaginal discharge.  She is currently midcycle,  Reporting having very heavy periods since her last pregnancy one year ago.  She is not breast feeding. She has found no alleviators for her symptoms.  She was advised by pcp today to be seen here to assess for possible appendicitis.     The history is provided by the patient and the spouse.    Past Medical History  Diagnosis Date  . AMA (advanced maternal age) multigravida 35+   . No pertinent past medical history   . Depression   . Fibroids    History reviewed. No pertinent past surgical history. Family History  Problem Relation Age of Onset  . Hypertension Father   . Multiple sclerosis Mother   . Cancer Maternal Aunt     uterine and lung  . Cancer Paternal Uncle     leukemia  . Cancer Maternal Grandmother     breast  . Heart disease Paternal Grandmother    History  Substance Use Topics  . Smoking status: Current Some Day Smoker -- 0.50 packs/day for 20 years    Types: Cigarettes  . Smokeless tobacco: Never Used  . Alcohol Use: No   OB History   Grav Para Term Preterm Abortions TAB SAB Ect Mult Living    5 2 2  2 2    2      Review of Systems  Constitutional: Positive for fever and appetite change.  HENT: Negative for congestion, sore throat and neck pain.   Eyes: Negative.   Respiratory: Negative for chest tightness and shortness of breath.   Cardiovascular: Negative for chest pain.  Gastrointestinal: Positive for nausea, vomiting, abdominal pain and constipation. Negative for blood in stool.  Genitourinary: Negative.  Negative for vaginal bleeding and vaginal discharge.  Musculoskeletal: Negative for joint swelling and arthralgias.  Skin: Negative.  Negative for rash and wound.  Neurological: Negative for dizziness, weakness, light-headedness, numbness and headaches.  Psychiatric/Behavioral: Negative.     Allergies  Bee venom and Onion  Home Medications   Current Outpatient Rx  Name  Route  Sig  Dispense  Refill  . ALPRAZolam (XANAX) 1 MG tablet   Oral   Take 1 tablet (1 mg total) by mouth 2 (two) times daily as needed for anxiety.   30 tablet   2   . sertraline (ZOLOFT) 100 MG tablet   Oral   Take 150 mg by mouth daily.         . cyclobenzaprine (FLEXERIL) 10 MG tablet   Oral   Take 1 tablet (10 mg total) by mouth 3 (three) times daily as needed for muscle spasms.   90 tablet  1   . fluticasone (FLONASE) 50 MCG/ACT nasal spray   Nasal   Place 1 spray into the nose 2 (two) times daily as needed.         . megestrol (MEGACE) 40 MG tablet   Oral   Take 1 tablet (40 mg total) by mouth daily.   60 tablet   1   . ondansetron (ZOFRAN) 8 MG tablet   Oral   Take 1 tablet (8 mg total) by mouth every 8 (eight) hours as needed for nausea.   12 tablet   0   . oxyCODONE-acetaminophen (PERCOCET/ROXICET) 5-325 MG per tablet   Oral   Take 1 tablet by mouth every 4 (four) hours as needed for pain.   15 tablet   0    BP 107/59  Pulse 71  Temp(Src) 97.4 F (36.3 C) (Oral)  Resp 20  Ht 5' 9.5" (1.765 m)  Wt 190 lb (86.183 kg)  BMI 27.67 kg/m2  SpO2 100%   LMP 04/27/2013 Physical Exam  Nursing note and vitals reviewed. Constitutional: She appears well-developed and well-nourished. No distress.  HENT:  Head: Normocephalic and atraumatic.  Eyes: Conjunctivae are normal.  Neck: Normal range of motion.  Cardiovascular: Normal rate, regular rhythm, normal heart sounds and intact distal pulses.   Pulmonary/Chest: Effort normal and breath sounds normal. She has no wheezes.  Abdominal: Soft. Bowel sounds are normal. There is no hepatosplenomegaly. There is tenderness in the right lower quadrant. There is no guarding, no CVA tenderness and no tenderness at McBurney's point.  Musculoskeletal: Normal range of motion.  Neurological: She is alert.  Skin: Skin is warm and dry.  Psychiatric: She has a normal mood and affect.    ED Course  Procedures (including critical care time) Labs Review Labs Reviewed  PREGNANCY, URINE  URINALYSIS, ROUTINE W REFLEX MICROSCOPIC  CBC WITH DIFFERENTIAL  COMPREHENSIVE METABOLIC PANEL   Imaging Review US Transvaginal Non-ob  05/13/2013   GYNECOLOGIC SONOGRAM   Emma Johnson is a 37 y.o. [id number] LMP 04/27/2013 for a pelvic  sonogram for ?endom mass per CT and menorrhagia.  Uterus                      9.5 x 6.2 x 4.9 cm, retroverted   Endometrium          23.6 mm, asymmetrical, with 3 x 2.5 cm hyperechoic  mass within cavity 3D rendering outlines mass   Right ovary             2.6 x 1.7 x 1.5 cm,   Left ovary                3.2 x 2.6 x 2.0 cm,   No free fluid or adnexal masses noted within pelvis  Technician Comments:  3 x 2.5cm endometrial mass outlined by 3D rendering with +Doppler flow  noted within    Emma Johnson 05/13/2013 9:55 AM     Ct Abdomen Pelvis W Contrast  05/11/2013   CLINICAL DATA:  Right lower quadrant abdominal pain.  EXAM: CT ABDOMEN AND PELVIS WITH CONTRAST  TECHNIQUE: Multidetector CT imaging of the abdomen and pelvis was performed using the standard protocol following bolus administration of  intravenous contrast.  CONTRAST:  50mL OMNIPAQUE IOHEXOL 300 MG/ML SOLN, OMNIPAQUE IOHEXOL 300 MG/ML SOLN  COMPARISON:  None.  FINDINGS: The lung bases are clear. No pleural effusion. The heart is normal.  The solid abdominal organs are normal. The gallbladder is  normal. No common bowel duct dilatation.  The stomach, duodenum, small bowel and colon are unremarkable. No inflammatory changes, mass lesions or obstructive findings. No mesenteric or retroperitoneal mass or adenopathy. The aorta is normal. The portal and splenic veins are patent. Both renal veins are patent.  The uterus is abnormal. There appears to be an endometrial lesion. Could not exclude pregnancy. A submucosal fibroid is possible. A large endometrial polyp is also possible. Recommend correlation with pregnancy test. The ovaries are normal. The appendix is normal.  The bony structures are unremarkable.  IMPRESSION: Endometrial lesion. Rule-out pregnancy. Other possibilities include submucosal fibroid or large endometrial polyp.  No other significant findings.   Electronically Signed   By: Loralie Champagne M.D.   On: 05/11/2013 23:34   [place] [facility]  05/13/2013   GYNECOLOGIC SONOGRAM   Emma Johnson is a 37 y.o. [id number] LMP 04/27/2013 for a pelvic  sonogram for ?endom mass per CT and menorrhagia.  Uterus                      9.5 x 6.2 x 4.9 cm, retroverted   Endometrium          23.6 mm, asymmetrical, with 3 x 2.5 cm hyperechoic  mass within cavity 3D rendering outlines mass   Right ovary             2.6 x 1.7 x 1.5 cm,   Left ovary                3.2 x 2.6 x 2.0 cm,   No free fluid or adnexal masses noted within pelvis  Technician Comments:  3 x 2.5cm endometrial mass outlined by 3D rendering with +Doppler flow  noted within    Emma Johnson 05/13/2013 9:55 AM      MDM   1. Abdominal pain   2. Uterine mass    Patients labs and/or radiological studies were viewed and considered during the medical decision making and  disposition process.  Discussed Ct finding with patient.  Advised f//u with her gynecologist for further evaluation of the uterine mass found on her Ct scan.  It is unclear if this is the source of her abdominal pain,  CT otherwise normal including healthy appearing appendix.  She was prescrbied oxycodone, zofran for pain and nausea.  She will call her gynecologist in the am for appt.  The patient appears reasonably screened and/or stabilized for discharge and I doubt any other medical condition or other [facility] requiring further screening, evaluation, or treatment in the ED at this time prior to discharge.      Burgess Amor, PA-C 05/13/13 1422

## 2013-05-17 NOTE — ED Provider Notes (Signed)
Medical screening examination/treatment/procedure(s) were performed by non-physician practitioner and as supervising physician I was immediately available for consultation/collaboration. Sire Poet, MD, FACEP   Lyla Jasek L Katara Griner, MD 05/17/13 1056 

## 2013-05-18 ENCOUNTER — Encounter (HOSPITAL_COMMUNITY): Payer: Self-pay | Admitting: *Deleted

## 2013-05-18 ENCOUNTER — Encounter (HOSPITAL_COMMUNITY)
Admission: RE | Disposition: A | Discharge status: Home / Self Care | Payer: Self-pay | Source: Ambulatory Visit | Attending: Obstetrics & Gynecology

## 2013-05-18 ENCOUNTER — Ambulatory Visit (HOSPITAL_COMMUNITY): Payer: PRIVATE HEALTH INSURANCE | Admitting: Anesthesiology

## 2013-05-18 ENCOUNTER — Ambulatory Visit (HOSPITAL_COMMUNITY)
Admission: RE | Admit: 2013-05-18 | Discharge: 2013-05-18 | Disposition: A | Discharge status: Home / Self Care | Payer: PRIVATE HEALTH INSURANCE | Source: Ambulatory Visit | Attending: Obstetrics & Gynecology | Admitting: Obstetrics & Gynecology

## 2013-05-18 ENCOUNTER — Encounter (HOSPITAL_COMMUNITY): Payer: PRIVATE HEALTH INSURANCE | Admitting: Anesthesiology

## 2013-05-18 DIAGNOSIS — N920 Excessive and frequent menstruation with regular cycle: Secondary | ICD-10-CM | POA: Insufficient documentation

## 2013-05-18 DIAGNOSIS — Z9889 Other specified postprocedural states: Secondary | ICD-10-CM

## 2013-05-18 DIAGNOSIS — N840 Polyp of corpus uteri: Secondary | ICD-10-CM

## 2013-05-18 DIAGNOSIS — N946 Dysmenorrhea, unspecified: Secondary | ICD-10-CM | POA: Insufficient documentation

## 2013-05-18 HISTORY — PX: DILITATION & CURRETTAGE/HYSTROSCOPY WITH THERMACHOICE ABLATION: SHX5569

## 2013-05-18 HISTORY — PX: POLYPECTOMY: SHX5525

## 2013-05-18 SURGERY — DILATATION & CURETTAGE/HYSTEROSCOPY WITH THERMACHOICE ABLATION
Anesthesia: General | Wound class: Clean Contaminated

## 2013-05-18 MED ORDER — 0.9 % SODIUM CHLORIDE (POUR BTL) OPTIME
TOPICAL | Status: DC | PRN
  Administered 2013-05-18: 1000 mL

## 2013-05-18 MED ORDER — CEFAZOLIN SODIUM-DEXTROSE 2-3 GM-% IV SOLR: 2.0000 g | INTRAVENOUS | Status: DC

## 2013-05-18 MED ORDER — KETOROLAC TROMETHAMINE 10 MG PO TABS: 10.0000 mg | ORAL_TABLET | Freq: Three times a day (TID) | ORAL | Status: DC | PRN

## 2013-05-18 MED ORDER — CEFAZOLIN SODIUM 1-5 GM-% IV SOLN
1.0000 g | INTRAVENOUS | Status: AC
  Administered 2013-05-18: 2 g via INTRAVENOUS

## 2013-05-18 MED ORDER — DEXTROSE 5 % IV SOLN
INTRAVENOUS | Status: DC | PRN
  Administered 2013-05-18: 83 mL

## 2013-05-18 MED ORDER — ONDANSETRON HCL 4 MG/2ML IJ SOLN
INTRAMUSCULAR | Status: AC
  Filled 2013-05-18: qty 2

## 2013-05-18 MED ORDER — MIDAZOLAM HCL 2 MG/2ML IJ SOLN
INTRAMUSCULAR | Status: AC
  Filled 2013-05-18: qty 2

## 2013-05-18 MED ORDER — KETOROLAC TROMETHAMINE 30 MG/ML IJ SOLN
30.0000 mg | Freq: Once | INTRAMUSCULAR | Status: AC
  Administered 2013-05-18: 30 mg via INTRAVENOUS
  Filled 2013-05-18: qty 1

## 2013-05-18 MED ORDER — SODIUM CHLORIDE 0.9 % IR SOLN
Status: DC | PRN
  Administered 2013-05-18: 3000 mL

## 2013-05-18 MED ORDER — PROPOFOL 10 MG/ML IV BOLUS
INTRAVENOUS | Status: AC
  Filled 2013-05-18: qty 20

## 2013-05-18 MED ORDER — FENTANYL CITRATE 0.05 MG/ML IJ SOLN
25.0000 ug | INTRAMUSCULAR | Status: AC
Start: 2013-05-18 — End: 2013-05-18
  Administered 2013-05-18 (×2): 25 ug via INTRAVENOUS

## 2013-05-18 MED ORDER — FENTANYL CITRATE 0.05 MG/ML IJ SOLN
INTRAMUSCULAR | Status: DC | PRN
  Administered 2013-05-18 (×3): 50 ug via INTRAVENOUS
  Administered 2013-05-18 (×2): 25 ug via INTRAVENOUS
  Administered 2013-05-18: 50 ug via INTRAVENOUS

## 2013-05-18 MED ORDER — LIDOCAINE HCL (PF) 1 % IJ SOLN
INTRAMUSCULAR | Status: AC
  Filled 2013-05-18: qty 5

## 2013-05-18 MED ORDER — FENTANYL CITRATE 0.05 MG/ML IJ SOLN
INTRAMUSCULAR | Status: AC
  Filled 2013-05-18: qty 5

## 2013-05-18 MED ORDER — CEFAZOLIN SODIUM-DEXTROSE 2-3 GM-% IV SOLR
INTRAVENOUS | Status: AC
  Filled 2013-05-18: qty 50

## 2013-05-18 MED ORDER — FENTANYL CITRATE 0.05 MG/ML IJ SOLN
INTRAMUSCULAR | Status: AC
  Filled 2013-05-18: qty 2

## 2013-05-18 MED ORDER — LIDOCAINE HCL (CARDIAC) 20 MG/ML IV SOLN
INTRAVENOUS | Status: DC | PRN
  Administered 2013-05-18: 30 mg via INTRAVENOUS

## 2013-05-18 MED ORDER — LACTATED RINGERS IV SOLN
INTRAVENOUS | Status: DC
  Administered 2013-05-18: 1000 mL via INTRAVENOUS

## 2013-05-18 MED ORDER — MIDAZOLAM HCL 2 MG/2ML IJ SOLN
1.0000 mg | INTRAMUSCULAR | Status: DC | PRN
  Administered 2013-05-18: 2 mg via INTRAVENOUS

## 2013-05-18 MED ORDER — ONDANSETRON HCL 4 MG/2ML IJ SOLN
4.0000 mg | Freq: Once | INTRAMUSCULAR | Status: AC | PRN
  Administered 2013-05-18: 4 mg via INTRAVENOUS
  Filled 2013-05-18: qty 2

## 2013-05-18 MED ORDER — PROPOFOL 10 MG/ML IV BOLUS
INTRAVENOUS | Status: DC | PRN
  Administered 2013-05-18: 170 mg via INTRAVENOUS

## 2013-05-18 MED ORDER — FENTANYL CITRATE 0.05 MG/ML IJ SOLN
25.0000 ug | INTRAMUSCULAR | Status: DC | PRN
  Administered 2013-05-18 (×2): 50 ug via INTRAVENOUS

## 2013-05-18 MED ORDER — CEFAZOLIN SODIUM 1-5 GM-% IV SOLN: 1.0000 g | INTRAVENOUS | Status: DC

## 2013-05-18 MED ORDER — ONDANSETRON HCL 4 MG/2ML IJ SOLN
4.0000 mg | Freq: Once | INTRAMUSCULAR | Status: AC
  Administered 2013-05-18: 4 mg via INTRAVENOUS

## 2013-05-18 SURGICAL SUPPLY — 30 items
BAG DECANTER FOR FLEXI CONT (MISCELLANEOUS) ×2 IMPLANT
BAG HAMPER (MISCELLANEOUS) ×2 IMPLANT
CATH THERMACHOICE III (CATHETERS) ×2 IMPLANT
CLOTH BEACON ORANGE TIMEOUT ST (SAFETY) ×2 IMPLANT
COVER LIGHT HANDLE STERIS (MISCELLANEOUS) ×4 IMPLANT
COVER MAYO STAND XLG (DRAPE) ×2 IMPLANT
FORMALIN 10 PREFIL 120ML (MISCELLANEOUS) ×3 IMPLANT
GAUZE SPONGE 4X4 16PLY XRAY LF (GAUZE/BANDAGES/DRESSINGS) ×2 IMPLANT
GLOVE BIOGEL PI IND STRL 7.0 (GLOVE) IMPLANT
GLOVE BIOGEL PI IND STRL 8 (GLOVE) ×1 IMPLANT
GLOVE BIOGEL PI INDICATOR 7.0 (GLOVE) ×1
GLOVE BIOGEL PI INDICATOR 8 (GLOVE) ×1
GLOVE ECLIPSE 6.5 STRL STRAW (GLOVE) ×1 IMPLANT
GLOVE ECLIPSE 8.0 STRL XLNG CF (GLOVE) ×2 IMPLANT
GOWN STRL REIN XL XLG (GOWN DISPOSABLE) ×4 IMPLANT
INST SET HYSTEROSCOPY (KITS) ×2 IMPLANT
IV D5W 500ML (IV SOLUTION) ×2 IMPLANT
IV NS IRRIG 3000ML ARTHROMATIC (IV SOLUTION) ×2 IMPLANT
KIT ROOM TURNOVER AP CYSTO (KITS) ×2 IMPLANT
MANIFOLD NEPTUNE II (INSTRUMENTS) ×2 IMPLANT
MARKER SKIN DUAL TIP RULER LAB (MISCELLANEOUS) ×2 IMPLANT
PACK BASIC III (CUSTOM PROCEDURE TRAY) ×2
PACK SRG BSC III STRL LF ECLPS (CUSTOM PROCEDURE TRAY) ×1 IMPLANT
PAD ARMBOARD 7.5X6 YLW CONV (MISCELLANEOUS) ×2 IMPLANT
PAD TELFA 3X4 1S STER (GAUZE/BANDAGES/DRESSINGS) ×3 IMPLANT
SET BASIN LINEN APH (SET/KITS/TRAYS/PACK) ×2 IMPLANT
SET IRRIG Y TYPE TUR BLADDER L (SET/KITS/TRAYS/PACK) ×2 IMPLANT
SHEET LAVH (DRAPES) ×2 IMPLANT
SOLUTION ANTI FOG 6CC (MISCELLANEOUS) ×1 IMPLANT
YANKAUER SUCT BULB TIP 10FT TU (MISCELLANEOUS) ×2 IMPLANT

## 2013-05-18 NOTE — Op Note (Signed)
Preoperative diagnosis:  Endometrial mass, favor polyp over myoma                                         Menometrorrhagia                                         Dysmenorrhea   Postoperative diagnoses: Same as above   Procedure: Hysteroscopy, removal of endometrial polyp, uterine curettage, endometrial ablation  Surgeon: Despina Hidden MD  Anesthesia: Laryngeal mask airway  Findings: The endometrium was significant for a very soft vascular endometrial polyp.  There were no fibroid or other abnormalities noted.  Description of operation: The patient was taken to the operating room and placed in the supine position. She underwent general anesthesia using the laryngeal mask airway. She was placed in the dorsal lithotomy position and prepped and draped in the usual sterile fashion. A Graves speculum was placed and the anterior cervical lip was grasped with a single-tooth tenaculum. The cervix was dilated serially to allow passage of the hysteroscope. Diagnostic hysteroscopy was performed and the above findings were noted.  The polyp was quite soft and fleshy and adhernet on the left uterine wall.  The polyp was removed using ring forceps and hysteroscopy confirmed complete removal.  A vigorous uterine curettage was then performed and all tissue sent to pathology for evaluation.  The polyp and endometrial curettings were sent seperately.  The ThermaChoice 3 endometrial ablation balloon was then used were 83 cc of D5W was required to maintain a pressure of 190-200 mm of mercury throughout the procedure. Toatl therapy time was 24:00.  Therapy temperature was 80 degrees Celsius.  All of the equipment worked well throughout the procedure. All of the fluid was returned at the end of the procedure. The patient was awakened from anesthesia and taken to the recovery room in good stable condition all counts were correct. She received 2 g of Ancef and 30 mg of Toradol preoperatively. She will be discharged from the recovery  room and followed up in the office in 1- 2 weeks.  Emma Johnson 05/18/2013 11:00 AM

## 2013-05-18 NOTE — Anesthesia Procedure Notes (Signed)
Procedure Name: LMA Insertion Date/Time: 05/18/2013 9:51 AM Performed by: Glynn Octave E Pre-anesthesia Checklist: Patient identified, Patient being monitored, Emergency Drugs available, Timeout performed and Suction available Patient Re-evaluated:Patient Re-evaluated prior to inductionOxygen Delivery Method: Circle System Utilized Preoxygenation: Pre-oxygenation with 100% oxygen Intubation Type: IV induction Ventilation: Mask ventilation without difficulty LMA: LMA inserted LMA Size: 4.0 Number of attempts: 1 Placement Confirmation: positive ETCO2 and breath sounds checked- equal and bilateral

## 2013-05-18 NOTE — Transfer of Care (Signed)
Immediate Anesthesia Transfer of Care Note  Patient: Emma Johnson  Procedure(s) Performed: Procedure(s): DILATATION & CURETTAGE/HYSTEROSCOPY WITH THERMACHOICE ABLATION (Total Therapy Time=105minutes) (N/A) ENDOMETRIAL POLYPECTOMY (N/A)  Patient Location: PACU  Anesthesia Type:General  Level of Consciousness: awake and alert   Airway & Oxygen Therapy: Patient Spontanous Breathing and Patient connected to face mask oxygen  Post-op Assessment: Report given to PACU RN  Post vital signs: Reviewed  Complications: No apparent anesthesia complications

## 2013-05-18 NOTE — Anesthesia Preprocedure Evaluation (Signed)
Anesthesia Evaluation   Patient awake    Reviewed: Allergy & Precautions, H&P , NPO status , Patient's Chart, lab work & pertinent test results  Airway Mallampati: I TM Distance: >3 FB     Dental  (+) Teeth Intact   Pulmonary Current Smoker,  breath sounds clear to auscultation        Cardiovascular negative cardio ROS  Rhythm:Regular Rate:Normal     Neuro/Psych PSYCHIATRIC DISORDERS Depression    GI/Hepatic negative GI ROS,   Endo/Other    Renal/GU      Musculoskeletal   Abdominal   Peds  Hematology   Anesthesia Other Findings   Reproductive/Obstetrics                           Anesthesia Physical Anesthesia Plan  ASA: II  Anesthesia Plan: General   Post-op Pain Management:    Induction: Intravenous  Airway Management Planned: LMA  Additional Equipment:   Intra-op Plan:   Post-operative Plan: Extubation in OR  Informed Consent: I have reviewed the patients History and Physical, chart, labs and discussed the procedure including the risks, benefits and alternatives for the proposed anesthesia with the patient or authorized representative who has indicated his/her understanding and acceptance.     Plan Discussed with:   Anesthesia Plan Comments:         Anesthesia Quick Evaluation

## 2013-05-18 NOTE — H&P (Signed)
Preoperative History and Physical  Emma Johnson is a 37 y.o. [phone] with Patient's last menstrual period was 04/27/2013. admitted for a hysteroscopy uterine curettage endometrial ablation and removal of large endometrial polyp.  Pt has been having increasingly heavy and painful menses since birth of her child last year. CT scan done for an unrelated issue revealed a large 3 cm endometrial mass and a sonogram in the office confirms a polyp. Fo management and removal  PMH:  Past Medical History   Diagnosis  Date   .  AMA (advanced maternal age) multigravida 35+    .  No pertinent past medical history    .  Depression    .  Fibroids    PSH:  Past Surgical History   Procedure  Laterality  Date   .  No past surgeries     POb/GynH:  OB History    Grav  Para  Term  Preterm  Abortions  TAB  SAB  Ect  Mult  Living    5  2  2   2  2     2      SH:  History   Substance Use Topics   .  Smoking status:  Current Some Day Smoker -- 0.30 packs/day     Types:  Cigarettes   .  Smokeless tobacco:  Never Used   .  Alcohol Use:  No   FH:  Family History   Problem  Relation  Age of Onset   .  Hypertension  Father    .  Multiple sclerosis  Mother    .  Cancer  Maternal Aunt      uterine and lung   .  Cancer  Paternal Uncle      leukemia   .  Cancer  Maternal Grandmother      breast   .  Heart disease  Paternal Grandmother    Allergies:  Allergies   Allergen  Reactions   .  Bee Venom    Medications: Current outpatient prescriptions:ALPRAZolam (XANAX) 1 MG tablet, Take 1 tablet (1 mg total) by mouth 2 (two) times daily as needed for anxiety., Disp: 30 tablet, Rfl: 2; cyclobenzaprine (FLEXERIL) 10 MG tablet, Take 1 tablet (10 mg total) by mouth 3 (three) times daily as needed for muscle spasms., Disp: 90 tablet, Rfl: 1; megestrol (MEGACE) 40 MG tablet, Take 1 tablet (40 mg total) by mouth daily., Disp: 60 tablet, Rfl: 1  naproxen sodium (ALEVE) 220 MG tablet, Take 440 mg by mouth daily as  needed (for pain)., Disp: , Rfl: ; ondansetron (ZOFRAN) 8 MG tablet, Take 1 tablet (8 mg total) by mouth every 8 (eight) hours as needed for nausea., Disp: 12 tablet, Rfl: 0; oxyCODONE-acetaminophen (PERCOCET/ROXICET) 5-325 MG per tablet, Take 1 tablet by mouth every 4 (four) hours as needed for pain., Disp: 15 tablet, Rfl: 0  sertraline (ZOLOFT) 100 MG tablet, Take 150 mg by mouth daily., Disp: , Rfl:  Review of Systems:  Review of Systems  Constitutional: Negative for fever, chills, weight loss, malaise/fatigue and diaphoresis.  HENT: Negative for hearing loss, ear pain, nosebleeds, congestion, sore throat, neck pain, tinnitus and ear discharge.  Eyes: Negative for blurred vision, double vision, photophobia, pain, discharge and redness.  Respiratory: Negative for cough, hemoptysis, sputum production, shortness of breath, wheezing and stridor.  Cardiovascular: Negative for chest pain, palpitations, orthopnea, claudication, leg swelling and PND.  Gastrointestinal: Positive for abdominal pain. Negative for heartburn, nausea, vomiting, diarrhea, constipation, blood in  stool and melena.  Genitourinary: Negative for dysuria, urgency, frequency, hematuria and flank pain.  Musculoskeletal: Negative for myalgias, back pain, joint pain and falls.  Skin: Negative for itching and rash.  Neurological: Negative for dizziness, tingling, tremors, sensory change, speech change, focal weakness, seizures, loss of consciousness, weakness and headaches.  Endo/Heme/Allergies: Negative for environmental allergies and polydipsia. Does not bruise/bleed easily.  Psychiatric/Behavioral: Negative for depression, suicidal ideas, hallucinations, memory loss and substance abuse. The patient is not nervous/anxious and does not have insomnia.  PHYSICAL EXAM:  Last menstrual period 04/27/2013, not currently breastfeeding.  Vitals reviewed.  Constitutional: She is oriented to person, place, and time. She appears well-developed  and well-nourished.  HENT:  Head: Normocephalic and atraumatic.  Right Ear: External ear normal.  Left Ear: External ear normal.  Nose: Nose normal.  Mouth/Throat: Oropharynx is clear and moist.  Eyes: Conjunctivae and EOM are normal. Pupils are equal, round, and reactive to light. Right eye exhibits no discharge. Left eye exhibits no discharge. No scleral icterus.  Neck: Normal range of motion. Neck supple. No tracheal deviation present. No thyromegaly present.  Cardiovascular: Normal rate, regular rhythm, normal heart sounds and intact distal pulses. Exam reveals no gallop and no friction rub.  No murmur heard.  Respiratory: Effort normal and breath sounds normal. No respiratory distress. She has no wheezes. She has no rales. She exhibits no tenderness.  GI: Soft. Bowel sounds are normal. She exhibits no distension and no mass. There is tenderness. There is no rebound and no guarding.  Genitourinary:  Vulva is normal without lesions Vagina is pink moist without discharge Cervix normal in appearance and pap is normal Uterus is normal size shape and contour with large 3 cm endometrial polyp Adnexa is negative with normal sized ovaries by sonogram  Musculoskeletal: Normal range of motion. She exhibits no edema and no tenderness.  Neurological: She is alert and oriented to person, place, and time. She has normal reflexes. She displays normal reflexes. No cranial nerve deficit. She exhibits normal muscle tone. Coordination normal.  Skin: Skin is warm and dry. No rash noted. No erythema. No pallor.  Psychiatric: She has a normal mood and affect. Her behavior is normal. Judgment and thought content normal.  Labs:  Results for orders placed during the hospital encounter of 05/11/13 (from the past 336 hour(s))   PREGNANCY, URINE    Collection Time    05/11/13 6:14 PM   Result  Value  Range    Preg Test, Ur  NEGATIVE  NEGATIVE   URINALYSIS, ROUTINE W REFLEX MICROSCOPIC    Collection Time     05/11/13 6:14 PM   Result  Value  Range    Color, Urine  YELLOW  YELLOW    APPearance  CLEAR  CLEAR    Specific Gravity, Urine  1.025  1.005 - 1.030    pH  6.0  5.0 - 8.0    Glucose, UA  NEGATIVE  NEGATIVE mg/dL    Hgb urine dipstick  NEGATIVE  NEGATIVE    Bilirubin Urine  NEGATIVE  NEGATIVE    Ketones, ur  NEGATIVE  NEGATIVE mg/dL    Protein, ur  NEGATIVE  NEGATIVE mg/dL    Urobilinogen, UA  0.2  0.0 - 1.0 mg/dL    Nitrite  NEGATIVE  NEGATIVE    Leukocytes, UA  NEGATIVE  NEGATIVE   CBC WITH DIFFERENTIAL    Collection Time    05/11/13 9:32 PM   Result  Value  Range  WBC  6.0  4.0 - 10.5 K/uL    RBC  4.67  3.87 - 5.11 MIL/uL    Hemoglobin  13.4  12.0 - 15.0 g/dL    HCT  08.6  57.8 - 46.9 %    MCV  85.4  78.0 - 100.0 fL    MCH  28.7  26.0 - 34.0 pg    MCHC  33.6  30.0 - 36.0 g/dL    RDW  62.9  52.8 - 41.3 %    Platelets  172  150 - 400 K/uL    Neutrophils Relative %  52  43 - 77 %    Neutro Abs  3.1  1.7 - 7.7 K/uL    Lymphocytes Relative  37  12 - 46 %    Lymphs Abs  2.2  0.7 - 4.0 K/uL    Monocytes Relative  9  3 - 12 %    Monocytes Absolute  0.5  0.1 - 1.0 K/uL    Eosinophils Relative  2  0 - 5 %    Eosinophils Absolute  0.1  0.0 - 0.7 K/uL    Basophils Relative  1  0 - 1 %    Basophils Absolute  0.0  0.0 - 0.1 K/uL   COMPREHENSIVE METABOLIC PANEL    Collection Time    05/11/13 9:32 PM   Result  Value  Range    Sodium  139  135 - 145 mEq/L    Potassium  3.9  3.5 - 5.1 mEq/L    Chloride  106  96 - 112 mEq/L    CO2  25  19 - 32 mEq/L    Glucose, Bld  95  70 - 99 mg/dL    BUN  9  6 - 23 mg/dL    Creatinine, Ser  2.44  0.50 - 1.10 mg/dL    Calcium  9.2  8.4 - 10.5 mg/dL    Total Protein  7.2  6.0 - 8.3 g/dL    Albumin  3.9  3.5 - 5.2 g/dL    AST  16  0 - 37 U/L    ALT  10  0 - 35 U/L    Alkaline Phosphatase  63  39 - 117 U/L    Total Bilirubin  0.4  0.3 - 1.2 mg/dL    GFR calc non Af Amer  >90  >90 mL/min    GFR calc Af Amer  >90  >90 mL/min   EKG:  No orders  found for this or any previous visit.  Imaging Studies:  US Transvaginal Non-ob  05/13/2013 GYNECOLOGIC SONOGRAM LENNETTE FADER is a 37 y.o. [id number] LMP 04/27/2013 for a pelvic sonogram for ?endom mass per CT and menorrhagia. Uterus 9.5 x 6.2 x 4.9 cm, retroverted Endometrium 23.6 mm, asymmetrical, with 3 x 2.5 cm hyperechoic mass within cavity 3D rendering outlines mass Right ovary 2.6 x 1.7 x 1.5 cm, Left ovary 3.2 x 2.6 x 2.0 cm, No free fluid or adnexal masses noted within pelvis Technician Comments: 3 x 2.5cm endometrial mass outlined by 3D rendering with +Doppler flow noted within Chari Manning 05/13/2013 9:55 AM  Ct Abdomen Pelvis W Contrast  05/11/2013 CLINICAL DATA: Right lower quadrant abdominal pain. EXAM: CT ABDOMEN AND PELVIS WITH CONTRAST TECHNIQUE: Multidetector CT imaging of the abdomen and pelvis was performed using the standard protocol following bolus administration of intravenous contrast. CONTRAST: 50mL OMNIPAQUE IOHEXOL 300 MG/ML SOLN, OMNIPAQUE IOHEXOL 300 MG/ML SOLN COMPARISON: None. FINDINGS:  The lung bases are clear. No pleural effusion. The heart is normal. The solid abdominal organs are normal. The gallbladder is normal. No common bowel duct dilatation. The stomach, duodenum, small bowel and colon are unremarkable. No inflammatory changes, mass lesions or obstructive findings. No mesenteric or retroperitoneal mass or adenopathy. The aorta is normal. The portal and splenic veins are patent. Both renal veins are patent. The uterus is abnormal. There appears to be an endometrial lesion. Could not exclude pregnancy. A submucosal fibroid is possible. A large endometrial polyp is also possible. Recommend correlation with pregnancy test. The ovaries are normal. The appendix is normal. The bony structures are unremarkable. IMPRESSION: Endometrial lesion. Rule-out pregnancy. Other possibilities include submucosal fibroid or large endometrial polyp. No other significant findings.  Electronically Signed By: Loralie Champagne M.D. On: 05/11/2013 23:34  [place] [facility]  05/13/2013 GYNECOLOGIC SONOGRAM JERIS EASTERLY is a 37 y.o. [id number] LMP 04/27/2013 for a pelvic sonogram for ?endom mass per CT and menorrhagia. Uterus 9.5 x 6.2 x 4.9 cm, retroverted Endometrium 23.6 mm, asymmetrical, with 3 x 2.5 cm hyperechoic mass within cavity 3D rendering outlines mass Right ovary 2.6 x 1.7 x 1.5 cm, Left ovary 3.2 x 2.6 x 2.0 cm, No free fluid or adnexal masses noted within pelvis Technician Comments: 3 x 2.5cm endometrial mass outlined by 3D rendering with +Doppler flow noted within Chari Manning 05/13/2013 9:55 AM  Assessment:  Endometrial polyp  menometrorrhagia  Patient Active Problem List    Diagnosis  Date Noted   .  Excessive or frequent menstruation  05/12/2013   .  Polyp of corpus uteri  05/12/2013   .  Obsessive compulsive disorder  01/03/2013   .  Generalized anxiety disorder  11/12/2012   Plan:  Hysteroscopic removal of endometrial polyp uterine curettage and endometrial ablation using thermachoice    EURE,LUTHER H 05/18/2013 8:56 AM

## 2013-05-18 NOTE — Anesthesia Postprocedure Evaluation (Signed)
  Anesthesia Post-op Note  Patient: Emma Johnson  Procedure(s) Performed: Procedure(s): DILATATION & CURETTAGE/HYSTEROSCOPY WITH THERMACHOICE ABLATION (Total Therapy Time=13minutes) (N/A) ENDOMETRIAL POLYPECTOMY (N/A)  Patient Location: PACU  Anesthesia Type:General  Level of Consciousness: awake, alert  and oriented  Airway and Oxygen Therapy: Patient Spontanous Breathing and Patient connected to face mask oxygen  Post-op Pain: mild  Post-op Assessment: Post-op Vital signs reviewed, Patient's Cardiovascular Status Stable, Respiratory Function Stable, Patent Airway and No signs of Nausea or vomiting  Post-op Vital Signs: Reviewed and stable  Complications: No apparent anesthesia complications

## 2013-05-20 ENCOUNTER — Encounter (HOSPITAL_COMMUNITY): Payer: Self-pay | Admitting: Obstetrics & Gynecology

## 2013-05-25 ENCOUNTER — Encounter: Payer: Self-pay | Admitting: Obstetrics & Gynecology

## 2013-05-25 ENCOUNTER — Ambulatory Visit (INDEPENDENT_AMBULATORY_CARE_PROVIDER_SITE_OTHER): Payer: PRIVATE HEALTH INSURANCE | Admitting: Obstetrics & Gynecology

## 2013-05-25 VITALS — BP 100/70 | Wt 188.0 lb

## 2013-05-25 DIAGNOSIS — N840 Polyp of corpus uteri: Secondary | ICD-10-CM

## 2013-05-25 DIAGNOSIS — Z9889 Other specified postprocedural states: Secondary | ICD-10-CM

## 2013-05-25 NOTE — Progress Notes (Signed)
Patient ID: Emma Johnson, female   DOB: [birth year], 37 y.o.   MRN: [id number] [name] is postop day #7 from a hysteroscopy endometrial polyp removal uterine curettage and endometrial ablation She had difficulty the first 2 days of postoperative nausea and vomiting Her pain has significantly moderated Her bleeding is much better now a watery pinkish discharge  Exam Minimal discharge in the vault No cervical motion tenderness in adnexa is negative  We will see [name] back as needed her for yearly exam she understands that there'll be a couple months for she figures outer. Should not be lot

## 2013-06-06 ENCOUNTER — Telehealth: Payer: Self-pay | Admitting: Obstetrics & Gynecology

## 2013-06-06 ENCOUNTER — Encounter: Payer: Self-pay | Admitting: Obstetrics & Gynecology

## 2013-06-06 NOTE — Telephone Encounter (Signed)
Letter done, call pt and tell her to pick up or fax it to where she wants it.

## 2013-06-06 NOTE — Telephone Encounter (Signed)
Pt states had heavy bleeding and cramping on 06/05/2013 and today, pt states unable to work.  Pt had endo abl. 05/18/2013.  1. Requesting work note for the above dates.

## 2013-06-06 NOTE — Telephone Encounter (Signed)
Pt states will come by and pick up note and FMLA forms

## 2013-06-07 DIAGNOSIS — Z029 Encounter for administrative examinations, unspecified: Secondary | ICD-10-CM

## 2013-06-08 ENCOUNTER — Ambulatory Visit: Payer: PRIVATE HEALTH INSURANCE

## 2013-06-22 ENCOUNTER — Ambulatory Visit (INDEPENDENT_AMBULATORY_CARE_PROVIDER_SITE_OTHER): Payer: PRIVATE HEALTH INSURANCE | Admitting: *Deleted

## 2013-06-22 DIAGNOSIS — Z23 Encounter for immunization: Secondary | ICD-10-CM

## 2013-06-23 ENCOUNTER — Telehealth: Payer: Self-pay | Admitting: *Deleted

## 2013-06-23 NOTE — Telephone Encounter (Signed)
Per Dr. Despina Hidden, Pt to begin short term disability 05/11/2013 due to excessive menstrual bleeding and pain. Velna Hatchet, [employer], notified.

## 2013-06-23 NOTE — Telephone Encounter (Signed)
Emma Johnson, from [employer], called stating pt states short term disability to begin on 05/11/2013, however pt surgery, Endometrial Ablation and polypectomy,  performed on 05/18/2013.   Did pt need to begin short term disability on 05/11/2013, if so, why?

## 2013-07-11 ENCOUNTER — Ambulatory Visit (INDEPENDENT_AMBULATORY_CARE_PROVIDER_SITE_OTHER): Payer: PRIVATE HEALTH INSURANCE | Admitting: Family Medicine

## 2013-07-11 ENCOUNTER — Encounter: Payer: Self-pay | Admitting: Family Medicine

## 2013-07-11 VITALS — BP 112/90 | Temp 98.1°F | Ht 69.0 in | Wt 190.0 lb

## 2013-07-11 DIAGNOSIS — J329 Chronic sinusitis, unspecified: Secondary | ICD-10-CM

## 2013-07-11 MED ORDER — AMOXICILLIN-POT CLAVULANATE 875-125 MG PO TABS: 1.0000 | ORAL_TABLET | Freq: Two times a day (BID) | ORAL | Status: AC

## 2013-07-11 NOTE — Progress Notes (Signed)
   Subjective:    Patient ID: Emma Johnson, female    DOB: [birth year], 37 y.o.   MRN: [id number]  Sore Throat  This is a new problem. The current episode started in the past 7 days. The maximum temperature recorded prior to her arrival was 101 - 101.9 F. Associated symptoms include congestion, coughing and a hoarse voice. She has tried NSAIDs for the symptoms. The treatment provided mild relief.   Right max pain. Cones and goes. Pain sharp rad to right ear  Hoarse at times.  Diminished energy, dry coough, right ear pain  Pos strep exposure   Review of Systems  HENT: Positive for congestion and hoarse voice.   Respiratory: Positive for cough.    no vomiting no diarrhea no rash ROS otherwise negative     Objective:   Physical Exam   Alert hydration good. HET moderate nasal congestion. Right cheek right frontal tenderness. Neck tender anterior nodes right side pharynx normal lungs clear heart regular in rhythm     Assessment & Plan:  Impression rhinosinusitis #2 element of bronchitis plan patient encouraged to stop smoking Augmentin twice a day 10 days. Symptomatic care discussed. WSL

## 2013-07-21 ENCOUNTER — Telehealth: Payer: Self-pay | Admitting: Family Medicine

## 2013-07-21 ENCOUNTER — Encounter: Payer: Self-pay | Admitting: Family Medicine

## 2013-07-21 NOTE — Telephone Encounter (Signed)
ok 

## 2013-07-21 NOTE — Telephone Encounter (Signed)
Called patient and let her know that excuse was ready up front.

## 2013-07-21 NOTE — Telephone Encounter (Signed)
Patients daughter was having ear pain on 07/19/13 and 07/20/13 and she called Dr Avel Sensor office for advice. Her daughters issue cleared up, but Dr Avel Sensor office said they could not give her a work excuse. Since Emma Johnson is on FMLA she is hoping to have a work excuse for those two days.

## 2013-08-17 ENCOUNTER — Telehealth: Payer: Self-pay | Admitting: Family Medicine

## 2013-08-17 NOTE — Telephone Encounter (Signed)
ok 

## 2013-08-17 NOTE — Telephone Encounter (Signed)
Patient would like work excuse for today because she said she had a meltdown and was unable to go. She would like an excuse to return tomorrow 08/18/2013.

## 2013-08-18 ENCOUNTER — Encounter: Payer: Self-pay | Admitting: Family Medicine

## 2013-08-22 ENCOUNTER — Telehealth: Payer: Self-pay | Admitting: Nurse Practitioner

## 2013-08-22 MED ORDER — AMOXICILLIN-POT CLAVULANATE 875-125 MG PO TABS: 1.0000 | ORAL_TABLET | Freq: Two times a day (BID) | ORAL | Status: DC

## 2013-08-22 MED ORDER — HYDROCODONE-HOMATROPINE 5-1.5 MG/5ML PO SYRP: 5.0000 mL | ORAL_SOLUTION | ORAL | Status: DC | PRN

## 2013-08-22 NOTE — Telephone Encounter (Signed)
Has appt. 1/16; sinus symptoms started about a week ago; drainage and headache right side; scratchy throat . No fever. Cough when sleeping. No wheezing.

## 2013-08-26 ENCOUNTER — Encounter: Payer: Self-pay | Admitting: Nurse Practitioner

## 2013-08-26 ENCOUNTER — Ambulatory Visit (INDEPENDENT_AMBULATORY_CARE_PROVIDER_SITE_OTHER): Payer: 59 | Admitting: Nurse Practitioner

## 2013-08-26 VITALS — BP 112/74 | Ht 69.5 in | Wt 195.0 lb

## 2013-08-26 DIAGNOSIS — F411 Generalized anxiety disorder: Secondary | ICD-10-CM

## 2013-08-26 DIAGNOSIS — F429 Obsessive-compulsive disorder, unspecified: Secondary | ICD-10-CM

## 2013-08-26 MED ORDER — IBUPROFEN 800 MG PO TABS: 800.0000 mg | ORAL_TABLET | Freq: Three times a day (TID) | ORAL | Status: DC | PRN

## 2013-08-26 MED ORDER — ALPRAZOLAM 1 MG PO TABS: 1.0000 mg | ORAL_TABLET | Freq: Two times a day (BID) | ORAL | Status: DC | PRN

## 2013-08-26 MED ORDER — SERTRALINE HCL 100 MG PO TABS: 150.0000 mg | ORAL_TABLET | Freq: Every day | ORAL | Status: DC

## 2013-08-29 ENCOUNTER — Encounter: Payer: Self-pay | Admitting: Nurse Practitioner

## 2013-08-29 NOTE — Assessment & Plan Note (Signed)
.   guanFACINE (INTUNIV) 1 MG TB24    Sig: Take 1 tablet (1 mg total) by mouth daily. At bedtime for ADHD    Dispense:  30 tablet    Refill:  2    Please have family call back after one week so dosage can be increased if needed. Thanks.    Order Specific Question:  Supervising Provider    Answer:  LUKING, WILLIAM S [[id number]]     due to extreme side effects when medication is wearing off, will switch to Intuniv. Hold on clonidine due to concerns about BP if taken with Intuniv. DC methylphenidate. Family to call back in one week if dosage needs to be increased. OTC meds as directed for congestion and cough. Call back if worsens or persists. Otherwise followup in 3 months. 

## 2013-08-29 NOTE — Assessment & Plan Note (Signed)
.   guanFACINE (INTUNIV) 1 MG TB24    Sig: Take 1 tablet (1 mg total) by mouth daily. At bedtime for ADHD    Dispense:  30 tablet    Refill:  2    Please have family call back after one week so dosage can be increased if needed. Thanks.    Order Specific Question:  Supervising Provider    Answer:  Mikey Kirschner [[id number]]     due to extreme side effects when medication is wearing off, will switch to Intuniv. Hold on clonidine due to concerns about BP if taken with Intuniv. DC methylphenidate. Family to call back in one week if dosage needs to be increased. OTC meds as directed for congestion and cough. Call back if worsens or persists. Otherwise followup in 3 months.

## 2013-08-29 NOTE — Progress Notes (Signed)
Subjective:  Presents for recheck of her anxiety and OCD. Overall symptoms seem to be well-controlled. Uses occasional Xanax, not every day up to twice a day. Continues to have significant problems focusing and completing tasks. Has had some issues with this most of her life. Denies any adverse affects from Zoloft.  Objective:   BP 112/74  Ht 5' 9.5" (1.765 m)  Wt 195 lb (88.451 kg)  BMI 28.39 kg/m2 NAD. Alert, oriented. Cheerful affect. Thoughts logical coherent and relevant. Dressed appropriately. Lungs clear. Heart regular rate rhythm.  Assessment:Generalized anxiety disorder  Obsessive compulsive disorder  Plan: Meds ordered this encounter  Medications  . sertraline (ZOLOFT) 100 MG tablet    Sig: Take 1.5 tablets (150 mg total) by mouth daily.    Dispense:  45 tablet    Refill:  5    Order Specific Question:  Supervising Provider    Answer:  Mikey Kirschner [[id number]]  . ALPRAZolam (XANAX) 1 MG tablet    Sig: Take 1 tablet (1 mg total) by mouth 2 (two) times daily as needed for anxiety.    Dispense:  30 tablet    Refill:  2    Order Specific Question:  Supervising Provider    Answer:  Mikey Kirschner [[id number]]  . ibuprofen (ADVIL,MOTRIN) 800 MG tablet    Sig: Take 1 tablet (800 mg total) by mouth every 8 (eight) hours as needed.    Dispense:  30 tablet    Refill:  0    Order Specific Question:  Supervising Provider    Answer:  Mikey Kirschner [[id number]]  . amoxicillin-clavulanate (AUGMENTIN) 875-125 MG per tablet    Sig:    Questionable symptoms of ADD. Patient given ADD/ADHD checklist to do at home and to send back to our office. It is unclear at this point whether this is part of her anxiety and OCD symptoms or part of another problem. Continue current medications as directed. Recheck in 3 months, call back sooner if any problems.

## 2013-08-31 ENCOUNTER — Ambulatory Visit (INDEPENDENT_AMBULATORY_CARE_PROVIDER_SITE_OTHER): Payer: 59 | Admitting: Women's Health

## 2013-08-31 ENCOUNTER — Encounter (INDEPENDENT_AMBULATORY_CARE_PROVIDER_SITE_OTHER): Payer: Self-pay

## 2013-08-31 ENCOUNTER — Encounter: Payer: Self-pay | Admitting: Women's Health

## 2013-08-31 VITALS — BP 84/60 | Ht 69.5 in | Wt 192.0 lb

## 2013-08-31 DIAGNOSIS — L089 Local infection of the skin and subcutaneous tissue, unspecified: Secondary | ICD-10-CM

## 2013-08-31 MED ORDER — SULFAMETHOXAZOLE-TMP DS 800-160 MG PO TABS: 1.0000 | ORAL_TABLET | Freq: Two times a day (BID) | ORAL | Status: DC

## 2013-08-31 NOTE — Patient Instructions (Signed)

## 2013-08-31 NOTE — Progress Notes (Signed)
Patient ID: TEEGHAN HAMMER, female   DOB: March 10, 1976, 38 y.o.   MRN: [id number]   [facility] Visit  Patient name: Emma Johnson MRN [id number]  Date of birth: [birth year]  CC & HPI:  Emma Johnson is a 38 y.o. Caucasian female presenting today for report of tender cyst Lt panty line that she began noticing a few weeks ago. States she had a cyst removed from the same area ~ 39yrs ago. Denies fever/chills, drainage.  On Augmentin for sinus infection, rx'd by Sumner Boast, NP. Has appt 2/2 w/ [facility] for pap & physical.   Pertinent History Reviewed:  Medical & Surgical Hx:   Past Medical History  Diagnosis Date  . AMA (advanced maternal age) multigravida 86+   . No pertinent past medical history   . Depression   . Fibroids    Past Surgical History  Procedure Laterality Date  . Dilitation & currettage/hystroscopy with thermachoice ablation N/A 05/18/2013    Procedure: DILATATION & CURETTAGE/HYSTEROSCOPY WITH THERMACHOICE ABLATION (Total Therapy Time=19minutes);  Surgeon: Florian Buff, MD;  Location: [facility];  Service: Gynecology;  Laterality: N/A;  . Polypectomy N/A 05/18/2013    Procedure: ENDOMETRIAL POLYPECTOMY;  Surgeon: Florian Buff, MD;  Location: [facility];  Service: Gynecology;  Laterality: N/A;   Medications: Reviewed & Updated - see associated section Social History: Reviewed -  reports that she has been smoking Cigarettes.  She has a 10 pack-year smoking history. She has never used smokeless tobacco.  Objective Findings:  Vitals: BP 84/60  Ht 5' 9.5" (1.765 m)  Wt 192 lb (87.091 kg)  BMI 27.96 kg/m2  LMP 08/17/2013  Breastfeeding? No  Physical Examination: General appearance - alert, well appearing, and in no distress Lt panty line/inner thigh: ~2cm firm, flat, non-raised, tender area under prior incision. No erythema, edema, heat, drainage. Co-exam w/ JAG- possible sebaceous cyst  Assessment & Plan:  A:   Possible sebaceous cyst Lt panty line  Sinus  infection currently being treated w/ augmentin P:  Rx septra ds bid x 10d  Continue augmentin for sinus infection  Yogurt w/ live cultures prophylactically, call if develops yeast s/s   F/U 2/2 w/ [facility] as scheduled for pap & physical and for f/u cyst & discuss excision   Tawnya Crook CNM, [facility] 08/31/2013 3:14 PM

## 2013-09-05 ENCOUNTER — Ambulatory Visit: Payer: 59 | Admitting: Obstetrics & Gynecology

## 2013-09-06 ENCOUNTER — Telehealth: Payer: Self-pay | Admitting: Nurse Practitioner

## 2013-09-06 ENCOUNTER — Encounter: Payer: Self-pay | Admitting: Family Medicine

## 2013-09-06 NOTE — Telephone Encounter (Signed)
WE done Pt notified

## 2013-09-06 NOTE — Telephone Encounter (Signed)
Pt wants work excuse for 01/26-01/27  Returning 09/07/13  Due to anxiety/depression

## 2013-09-06 NOTE — Telephone Encounter (Signed)
ok 

## 2013-09-07 ENCOUNTER — Telehealth: Payer: Self-pay | Admitting: Family Medicine

## 2013-09-07 NOTE — Telephone Encounter (Signed)
Pt has been home with her daughters Radene Gunning, Terrence Dupont with vomiting an diarrhea Wants to know if we can give her another work excuse for 01/28-01/29 return on 01/30  Thanks

## 2013-09-07 NOTE — Telephone Encounter (Signed)
ok 

## 2013-09-08 ENCOUNTER — Encounter: Payer: Self-pay | Admitting: Family Medicine

## 2013-09-08 NOTE — Telephone Encounter (Signed)
Note done, patient notified.

## 2013-09-12 ENCOUNTER — Other Ambulatory Visit: Payer: PRIVATE HEALTH INSURANCE | Admitting: Obstetrics & Gynecology

## 2013-09-19 ENCOUNTER — Other Ambulatory Visit: Payer: Self-pay | Admitting: Family Medicine

## 2013-09-20 ENCOUNTER — Telehealth: Payer: Self-pay | Admitting: Family Medicine

## 2013-09-20 ENCOUNTER — Other Ambulatory Visit: Payer: 59 | Admitting: Obstetrics & Gynecology

## 2013-09-20 MED ORDER — PREDNISONE 20 MG PO TABS: ORAL_TABLET | ORAL | Status: DC

## 2013-09-20 NOTE — Telephone Encounter (Signed)
pred 20 three daily three d, two daily three d, tone daily two d

## 2013-09-20 NOTE — Telephone Encounter (Signed)
Patient has poison oak on her hands and is requesting prednisone if possible    [facility] 

## 2013-09-20 NOTE — Telephone Encounter (Signed)
Medication sent to pharmacy. Patient was notified.  

## 2013-10-03 ENCOUNTER — Encounter: Payer: Self-pay | Admitting: Family Medicine

## 2013-10-03 ENCOUNTER — Telehealth: Payer: Self-pay | Admitting: Family Medicine

## 2013-10-03 NOTE — Telephone Encounter (Signed)
Patient needs work excuse for last night due to anxiety attack

## 2013-10-03 NOTE — Telephone Encounter (Signed)
ok 

## 2013-10-03 NOTE — Telephone Encounter (Signed)
Notified patient letter is ready for pickup

## 2013-10-05 ENCOUNTER — Ambulatory Visit: Payer: 59 | Admitting: Nurse Practitioner

## 2013-10-07 ENCOUNTER — Ambulatory Visit: Payer: 59 | Admitting: Nurse Practitioner

## 2013-10-07 ENCOUNTER — Other Ambulatory Visit: Payer: 59 | Admitting: Obstetrics & Gynecology

## 2013-10-26 ENCOUNTER — Ambulatory Visit: Payer: 59 | Admitting: Nurse Practitioner

## 2013-11-03 ENCOUNTER — Ambulatory Visit (INDEPENDENT_AMBULATORY_CARE_PROVIDER_SITE_OTHER): Payer: 59 | Admitting: Nurse Practitioner

## 2013-11-03 ENCOUNTER — Encounter: Payer: Self-pay | Admitting: Nurse Practitioner

## 2013-11-03 VITALS — BP 108/70 | Ht 69.5 in | Wt 198.4 lb

## 2013-11-03 DIAGNOSIS — G479 Sleep disorder, unspecified: Secondary | ICD-10-CM

## 2013-11-03 DIAGNOSIS — F411 Generalized anxiety disorder: Secondary | ICD-10-CM

## 2013-11-03 MED ORDER — ALPRAZOLAM 1 MG PO TABS: 1.0000 mg | ORAL_TABLET | Freq: Two times a day (BID) | ORAL | Status: DC | PRN

## 2013-11-03 MED ORDER — HYDROCODONE-HOMATROPINE 5-1.5 MG/5ML PO SYRP: 5.0000 mL | ORAL_SOLUTION | ORAL | Status: DC | PRN

## 2013-11-08 ENCOUNTER — Encounter: Payer: Self-pay | Admitting: Nurse Practitioner

## 2013-11-08 NOTE — Progress Notes (Signed)
Subjective:  Presents for complaints Of a flareup of her anxiety symptoms. Extreme emotional lability. Mainly having stress at work. At this time is working third shift, 58 hours per week 6 days per week. Averages 5 a half hours sleep a day at best, usually has to get up sometime during that period. Has been tardy for work once due to extreme sleepiness and fatigue, has caused her to be written up . Denies suicidal or homicidal thoughts or ideation. Cannot take Xanax 1 mg for sleep since this causes prolonged drowsiness. At the end of visit patient mentions a frequent cough when she's trying to sleep requesting cough medicine.  Objective:   BP 108/70  Ht 5' 9.5" (1.765 m)  Wt 198 lb 6 oz (89.982 kg)  BMI 28.88 kg/m2  NAD. Alert, oriented. Lungs clear. Heart regular rate rhythm. Mildly anxious affect. Fatigued in appearance.   Assessment: Problem List Items Addressed This Visit     Other   Generalized anxiety disorder - Primary (Chronic)    Other Visit Diagnoses   Sleep disturbance          Plan: Meds ordered this encounter  Medications  . HYDROcodone-homatropine (HYCODAN) 5-1.5 MG/5ML syrup    Sig: Take 5 mLs by mouth every 4 (four) hours as needed.    Dispense:  120 mL    Refill:  0    Order Specific Question:  Supervising Provider    Answer:  Mikey Kirschner [[id number]]  . ALPRAZolam (XANAX) 1 MG tablet    Sig: Take 1 tablet (1 mg total) by mouth 2 (two) times daily as needed for anxiety.    Dispense:  30 tablet    Refill:  2    Order Specific Question:  Supervising Provider    Answer:  Mikey Kirschner [[id number]]  explained that her anxiety symptoms are most likely being exacerbated by long hours working with minimal rest. Recommend that she try half of her Xanax to see if this will work for now. Given a one-time prescription for Hycodan syrup, want to avoid excessive use of controlled meds. Do not take with Xanax. Also recommended patient consider restarting counseling that is  available from her work. No changes to her Zoloft dose at this time. Call back in 2 weeks if no improvement, sooner if needed.  Return in about 3 months (around 02/03/2014).

## 2013-11-16 DIAGNOSIS — Z0289 Encounter for other administrative examinations: Secondary | ICD-10-CM

## 2013-11-18 ENCOUNTER — Telehealth: Payer: Self-pay | Admitting: Nurse Practitioner

## 2013-11-18 NOTE — Telephone Encounter (Signed)
Left message on voicemail to return call.

## 2013-11-18 NOTE — Telephone Encounter (Signed)
Pt is calling to say the 1/2 a xanax you currently have her on is too much Wants to know if we can reduce the dosage? The remains sluggish an tired  After waking an feels this dosage is just too high.     Vladimir Faster, [place]

## 2013-11-18 NOTE — Telephone Encounter (Signed)
Yes. Cut in 1/2 or even 1/4. Next time we need to refill we can reduce the strength. Always better the lower the dose to get the job done.

## 2013-11-21 NOTE — Telephone Encounter (Signed)
Discussed with patient. Patient verbalized understanding. 

## 2013-11-25 ENCOUNTER — Ambulatory Visit: Payer: 59 | Admitting: Nurse Practitioner

## 2013-11-29 ENCOUNTER — Telehealth: Payer: Self-pay | Admitting: Family Medicine

## 2013-11-29 NOTE — Telephone Encounter (Signed)
Patient called to see if FMLA is ready.She stated she only wanted you to fill them out and she was told not ready yet. She wants to when will they be ready.She left them on the 8th for her self and Emma Johnson.

## 2013-11-29 NOTE — Telephone Encounter (Signed)
I will have them ready tomorrow. Thanks.

## 2014-01-11 ENCOUNTER — Ambulatory Visit: Payer: 59 | Admitting: Nurse Practitioner

## 2014-01-12 ENCOUNTER — Encounter: Payer: Self-pay | Admitting: Nurse Practitioner

## 2014-01-12 ENCOUNTER — Ambulatory Visit (INDEPENDENT_AMBULATORY_CARE_PROVIDER_SITE_OTHER): Payer: 59 | Admitting: Nurse Practitioner

## 2014-01-12 VITALS — BP 110/68 | Temp 98.4°F | Ht 69.5 in | Wt 198.0 lb

## 2014-01-12 DIAGNOSIS — F411 Generalized anxiety disorder: Secondary | ICD-10-CM

## 2014-01-12 DIAGNOSIS — J209 Acute bronchitis, unspecified: Secondary | ICD-10-CM

## 2014-01-12 DIAGNOSIS — J329 Chronic sinusitis, unspecified: Secondary | ICD-10-CM

## 2014-01-12 MED ORDER — HYDROCODONE-HOMATROPINE 5-1.5 MG/5ML PO SYRP: 5.0000 mL | ORAL_SOLUTION | ORAL | Status: DC | PRN

## 2014-01-12 MED ORDER — FEXOFENADINE HCL 180 MG PO TABS: ORAL_TABLET | ORAL | Status: DC

## 2014-01-12 MED ORDER — SERTRALINE HCL 100 MG PO TABS: 150.0000 mg | ORAL_TABLET | Freq: Every day | ORAL | Status: DC

## 2014-01-12 MED ORDER — ALBUTEROL SULFATE HFA 108 (90 BASE) MCG/ACT IN AERS
2.0000 | INHALATION_SPRAY | RESPIRATORY_TRACT | Status: DC | PRN
Start: 2014-01-12 — End: 2016-07-30

## 2014-01-12 MED ORDER — ALPRAZOLAM 0.5 MG PO TABS: 0.5000 mg | ORAL_TABLET | Freq: Three times a day (TID) | ORAL | Status: DC | PRN

## 2014-01-12 MED ORDER — AMOXICILLIN-POT CLAVULANATE 875-125 MG PO TABS
1.0000 | ORAL_TABLET | Freq: Two times a day (BID) | ORAL | Status: DC
Start: 2014-01-12 — End: 2014-08-29

## 2014-01-17 ENCOUNTER — Encounter: Payer: Self-pay | Admitting: Nurse Practitioner

## 2014-01-17 NOTE — Progress Notes (Signed)
Subjective:  Presents for routine followup. Takes Xanax 1 mg half tab to help her sleep, averages 6 hours sleep uninterrupted. The problem is the other half of a tablet tends to crumble. Patient does not take the entire 1 mg tablet most of the time due to drowsiness. Does take an occasional half tab during the day to help with severe anxiety. Complaints of sinus symptoms over the past 2-3 weeks. Frequent cough producing green mucus. Slight wheezing at times with deep breath. Frontal sinus pressure. Ear pain. Sore throat. No fever. Smoker.  Objective:   BP 110/68  Temp(Src) 98.4 F (36.9 C) (Oral)  Ht 5' 9.5" (1.765 m)  Wt 198 lb (89.812 kg)  BMI 28.83 kg/m2 NAD. Alert, oriented. TMs clear effusion, no erythema. Pharynx erythematous with green PND noted. Neck supple with mild soft anterior adenopathy. Lungs scattered coarse expiratory crackles, no wheezing or tachypnea. Heart regular rate rhythm.  Assessment:  Problem List Items Addressed This Visit     Other   Generalized anxiety disorder - Primary (Chronic)    Other Visit Diagnoses   Rhinosinusitis        Relevant Medications       fexofenadine (ALLEGRA) 180 MG tablet       HYDROcodone-homatropine (HYCODAN) 5-1.5 MG/5ML syrup       amoxicillin-clavulanate (AUGMENTIN) tablet 875-125 mg    Acute bronchitis          Plan: Meds ordered this encounter  Medications  . sertraline (ZOLOFT) 100 MG tablet    Sig: Take 1.5 tablets (150 mg total) by mouth daily.    Dispense:  45 tablet    Refill:  5    Order Specific Question:  Supervising Provider    Answer:  Mikey Kirschner [[id number]]  . fexofenadine (ALLEGRA) 180 MG tablet    Sig: TAKE ONE TABLET BY MOUTH EVERY DAY AS NEEDED FOR ALLERGIES    Dispense:  90 tablet    Refill:  0    Order Specific Question:  Supervising Provider    Answer:  Mikey Kirschner [[id number]]  . HYDROcodone-homatropine (HYCODAN) 5-1.5 MG/5ML syrup    Sig: Take 5 mLs by mouth every 4 (four) hours as needed.   Dispense:  120 mL    Refill:  0    Order Specific Question:  Supervising Provider    Answer:  Mikey Kirschner [[id number]]  . ALPRAZolam (XANAX) 0.5 MG tablet    Sig: Take 1 tablet (0.5 mg total) by mouth 3 (three) times daily as needed for anxiety or sleep.    Dispense:  90 tablet    Refill:  2    Order Specific Question:  Supervising Provider    Answer:  Mikey Kirschner [[id number]]  . albuterol (PROVENTIL HFA;VENTOLIN HFA) 108 (90 BASE) MCG/ACT inhaler    Sig: Inhale 2 puffs into the lungs every 4 (four) hours as needed for wheezing or shortness of breath.    Dispense:  1 Inhaler    Refill:  0    Order Specific Question:  Supervising Provider    Answer:  Mikey Kirschner [[id number]]  . amoxicillin-clavulanate (AUGMENTIN) 875-125 MG per tablet    Sig: Take 1 tablet by mouth 2 (two) times daily.    Dispense:  20 tablet    Refill:  0    Order Specific Question:  Supervising Provider    Answer:  Mikey Kirschner [[id number]]   continue Flonase. Discussed importance of smoking cessation. Call back in 4-5 days if no improvement,  sooner if worse. Will switch to lower dose Xanax 0.5 mg. Return in about 3 months (around 04/14/2014).

## 2014-02-06 ENCOUNTER — Ambulatory Visit: Payer: 59 | Admitting: Nurse Practitioner

## 2014-04-14 ENCOUNTER — Ambulatory Visit: Payer: 59 | Admitting: Nurse Practitioner

## 2014-06-12 ENCOUNTER — Encounter: Payer: Self-pay | Admitting: Nurse Practitioner

## 2014-08-29 ENCOUNTER — Encounter: Payer: Self-pay | Admitting: Nurse Practitioner

## 2014-08-29 ENCOUNTER — Ambulatory Visit (INDEPENDENT_AMBULATORY_CARE_PROVIDER_SITE_OTHER): Payer: Self-pay | Admitting: Nurse Practitioner

## 2014-08-29 VITALS — BP 122/80 | HR 86 | Ht 69.5 in | Wt 200.0 lb

## 2014-08-29 DIAGNOSIS — J300 Vasomotor rhinitis: Secondary | ICD-10-CM

## 2014-08-29 DIAGNOSIS — Z139 Encounter for screening, unspecified: Secondary | ICD-10-CM

## 2014-08-29 DIAGNOSIS — Z0000 Encounter for general adult medical examination without abnormal findings: Secondary | ICD-10-CM

## 2014-08-29 DIAGNOSIS — Z23 Encounter for immunization: Secondary | ICD-10-CM

## 2014-08-29 NOTE — Progress Notes (Signed)
   Subjective:    Patient ID: Emma Johnson, female    DOB: September 28, 1975, 39 y.o.   MRN: [id number]  HPI presents for her wellness physical. Is attending school for phlebotomy. Regular vision and dental exams. Married, same sexual partner. Limited activity. Has not done well with her diet. No cycles due to ablation.    Review of Systems  Constitutional: Negative for fever, activity change, appetite change and fatigue.  HENT: Negative for dental problem, ear pain, sinus pressure and sore throat.        Mild head congestion. Clear mucus.  Respiratory: Negative for cough, chest tightness, shortness of breath and wheezing.   Cardiovascular: Negative for chest pain.  Gastrointestinal: Positive for constipation. Negative for nausea, vomiting, abdominal pain, diarrhea and abdominal distention.  Genitourinary: Positive for enuresis. Negative for dysuria, urgency, frequency, vaginal bleeding, vaginal discharge, difficulty urinating, genital sores, menstrual problem and pelvic pain.       Slight leakage only with cough or sneezing       Objective:   Physical Exam  Constitutional: She is oriented to person, place, and time. She appears well-developed. No distress.  HENT:  Right Ear: External ear normal.  Left Ear: External ear normal.  Mouth/Throat: Oropharynx is clear and moist.  Neck: Normal range of motion. Neck supple. No tracheal deviation present. No thyromegaly present.  Cardiovascular: Normal rate, regular rhythm and normal heart sounds.  Exam reveals no gallop.   No murmur heard. Pulmonary/Chest: Effort normal and breath sounds normal.  Abdominal: Soft. She exhibits no distension. There is no tenderness.  Genitourinary:  Defers GU and breast exams.  Musculoskeletal: She exhibits no edema.  Lymphadenopathy:    She has no cervical adenopathy.  Neurological: She is alert and oriented to person, place, and time.  Skin: Skin is warm and dry. No rash noted.  Psychiatric: She has a  normal mood and affect. Her behavior is normal.  Vitals reviewed.         Assessment & Plan:  Routine general medical examination at a health care facility  Vasomotor rhinitis  Encounter for special screening examination - Plan: Varicella zoster antibody, IgG, Rubella screen  Need for vaccination - Plan: TB Skin Test, Flu vaccine nasal quad  Testing per program requirements. Recommend Hepatitis B vaccine at local HD. Patient to check on MMR. Recommend healthy diet and regular activity. Return in about 1 year (around 08/30/2015).

## 2014-08-30 LAB — VARICELLA ZOSTER ANTIBODY, IGG: Varicella IgG: 1572 Index — ABNORMAL HIGH (ref <135.00)

## 2014-08-30 LAB — RUBELLA SCREEN: Rubella: 4.07 Index — ABNORMAL HIGH (ref <0.90)

## 2014-08-31 ENCOUNTER — Other Ambulatory Visit: Payer: Self-pay | Admitting: *Deleted

## 2014-08-31 ENCOUNTER — Telehealth: Payer: Self-pay | Admitting: Family Medicine

## 2014-08-31 LAB — TB SKIN TEST
INDURATION: 0 mm
TB SKIN TEST: NEGATIVE

## 2014-08-31 MED ORDER — BENZONATATE 100 MG PO CAPS: 100.0000 mg | ORAL_CAPSULE | Freq: Four times a day (QID) | ORAL | Status: DC | PRN

## 2014-08-31 MED ORDER — AMOXICILLIN-POT CLAVULANATE 875-125 MG PO TABS: 1.0000 | ORAL_TABLET | Freq: Two times a day (BID) | ORAL | Status: DC

## 2014-08-31 MED ORDER — AMOXICILLIN 500 MG PO CAPS: 500.0000 mg | ORAL_CAPSULE | Freq: Three times a day (TID) | ORAL | Status: DC

## 2014-08-31 NOTE — Telephone Encounter (Signed)
Med sent to [facility] . Pt notified.

## 2014-08-31 NOTE — Telephone Encounter (Signed)
[facility] - need to find out which pharm. [facility]

## 2014-08-31 NOTE — Telephone Encounter (Signed)
Pt seen Emma Johnson on 08/29/14 for rhinitis.  She is now having a bad cough, colored phlegm, facial pressure.  Can we call in antibiotic and cough medicine?  [facility] [place]

## 2014-08-31 NOTE — Telephone Encounter (Signed)
Aug 875bid for ten d, tess perles 100 mg 24 one q6hrs prn cough

## 2014-08-31 NOTE — Telephone Encounter (Signed)
No fever, no wheezing, no SOB.  Sinus pressure.

## 2014-08-31 NOTE — Telephone Encounter (Signed)
amox 500 tid ten d 

## 2014-08-31 NOTE — Telephone Encounter (Signed)
Left VM stating the medication was sent in.

## 2014-08-31 NOTE — Telephone Encounter (Signed)
Patient called stating that the augmentin that was called in is too expensive.  Can we try a different medication?

## 2014-09-12 ENCOUNTER — Ambulatory Visit (INDEPENDENT_AMBULATORY_CARE_PROVIDER_SITE_OTHER): Payer: Self-pay | Admitting: *Deleted

## 2014-09-12 DIAGNOSIS — Z111 Encounter for screening for respiratory tuberculosis: Secondary | ICD-10-CM

## 2014-09-15 LAB — TB SKIN TEST
Induration: 0 mm
TB Skin Test: NEGATIVE

## 2014-10-09 ENCOUNTER — Telehealth: Payer: Self-pay | Admitting: Nurse Practitioner

## 2014-10-09 MED ORDER — ALPRAZOLAM 0.5 MG PO TABS: 0.5000 mg | ORAL_TABLET | Freq: Three times a day (TID) | ORAL | Status: DC | PRN

## 2014-10-09 NOTE — Telephone Encounter (Signed)
    ALPRAZolam (XANAX) 0.5 MG tablet      Pt needs this med refilled please   Last script written 01/12/14 with two refills   Last OV for this 01/12/14, PE done 08/2014

## 2014-10-09 NOTE — Telephone Encounter (Signed)
Ok times one 

## 2014-10-09 NOTE — Telephone Encounter (Signed)
Patient notified

## 2014-10-11 ENCOUNTER — Other Ambulatory Visit: Payer: Self-pay | Admitting: *Deleted

## 2015-02-09 ENCOUNTER — Ambulatory Visit (INDEPENDENT_AMBULATORY_CARE_PROVIDER_SITE_OTHER): Payer: Medicaid Other | Admitting: Nurse Practitioner

## 2015-02-09 ENCOUNTER — Ambulatory Visit (HOSPITAL_COMMUNITY)
Admission: RE | Admit: 2015-02-09 | Discharge: 2015-02-09 | Disposition: A | Discharge status: Home / Self Care | Payer: Medicaid Other | Source: Ambulatory Visit | Attending: Nurse Practitioner | Admitting: Nurse Practitioner

## 2015-02-09 VITALS — BP 118/82 | Temp 98.4°F | Wt 200.0 lb

## 2015-02-09 DIAGNOSIS — K5900 Constipation, unspecified: Secondary | ICD-10-CM | POA: Diagnosis not present

## 2015-02-09 NOTE — Patient Instructions (Signed)
benefiber Stool softener  Fleets enema Then Magnesium citrate Bowel probiotic: activia yogurt 2 cups per day OR Align      Constipation Constipation is when a person has fewer than three bowel movements a week, has difficulty having a bowel movement, or has stools that are dry, hard, or larger than normal. As people grow older, constipation is more common. If you try to fix constipation with medicines that make you have a bowel movement (laxatives), the problem may get worse. Long-term laxative use may cause the muscles of the colon to become weak. A low-fiber diet, not taking in enough fluids, and taking certain medicines may make constipation worse.  CAUSES   Certain medicines, such as antidepressants, pain medicine, iron supplements, antacids, and water pills.   Certain diseases, such as diabetes, irritable bowel syndrome (IBS), thyroid disease, or depression.   Not drinking enough water.   Not eating enough fiber-rich foods.   Stress or travel.   Lack of physical activity or exercise.   Ignoring the urge to have a bowel movement.   Using laxatives too much.  SIGNS AND SYMPTOMS   Having fewer than three bowel movements a week.   Straining to have a bowel movement.   Having stools that are hard, dry, or larger than normal.   Feeling full or bloated.   Pain in the lower abdomen.   Not feeling relief after having a bowel movement.  DIAGNOSIS  Your health care provider will take a medical history and perform a physical exam. Further testing may be done for severe constipation. Some tests may include:  A barium enema X-ray to examine your rectum, colon, and, sometimes, your small intestine.   A sigmoidoscopy to examine your lower colon.   A colonoscopy to examine your entire colon. TREATMENT  Treatment will depend on the severity of your constipation and what is causing it. Some dietary treatments include drinking more fluids and eating more fiber-rich  foods. Lifestyle treatments may include regular exercise. If these diet and lifestyle recommendations do not help, your health care provider may recommend taking over-the-counter laxative medicines to help you have bowel movements. Prescription medicines may be prescribed if over-the-counter medicines do not work.  HOME CARE INSTRUCTIONS   Eat foods that have a lot of fiber, such as fruits, vegetables, whole grains, and beans.  Limit foods high in fat and processed sugars, such as french fries, hamburgers, cookies, candies, and soda.   A fiber supplement may be added to your diet if you cannot get enough fiber from foods.   Drink enough fluids to keep your urine clear or pale yellow.   Exercise regularly or as directed by your health care provider.   Go to the restroom when you have the urge to go. Do not hold it.   Only take over-the-counter or prescription medicines as directed by your health care provider. Do not take other medicines for constipation without talking to your health care provider first.  [place] IF:   You have bright red blood in your stool.   Your constipation lasts for more than 4 days or gets worse.   You have abdominal or rectal pain.   You have thin, pencil-like stools.   You have unexplained weight loss. MAKE SURE YOU:   Understand these instructions.  Will watch your condition.  Will get help right away if you are not doing well or get worse. Document Released: 04/25/2004 Document Revised: 08/02/2013 Document Reviewed: 05/09/2013 Exit[employer] Patient Information 2015  [employer]. This information is not intended to replace advice given to you by your health care provider. Make sure you discuss any questions you have with your health care provider.

## 2015-02-12 ENCOUNTER — Encounter: Payer: Self-pay | Admitting: Nurse Practitioner

## 2015-02-12 NOTE — Progress Notes (Signed)
Subjective:  Presents for recurrent abdominal pain, worse over the past month. Used to have regular BMs everyday, now having once a week, usually after taking dulcolax about every 2 weeks. No fever. No blood in stool. Having large, hard BMs. No major changes in diet. Bloating with gas. Urge to have BM with no results. Right mid abd pain at times radiating into side. Last BM 3 d ago. Vomiting x 4 yesterday, which is unusual. No acid reflux or heartburn. No change is stress level.   Objective:   BP 118/82 mmHg  Temp(Src) 98.4 F (36.9 C) (Oral)  Wt 200 lb (90.719 kg)  LMP 02/04/2015 NAD. Alert, oriented. Lungs clear. Heart RRR. Abdomen soft, mildly distended with active BS x 4; mild right mid abd tenderness. No obvious masses. No rebound or guarding.   Assessment: Constipation, unspecified constipation type - Plan: DG Abd 1 View  Plan: xray pending. Recommend daily benefiber and stool softeners. Fleets enema, dulcolax or magnesium citrate with severe constipation. Call back in 2 weeks if no better, sooner if worse. Warning signs reviewed.

## 2015-03-06 ENCOUNTER — Other Ambulatory Visit: Payer: Self-pay | Admitting: Nurse Practitioner

## 2015-03-06 ENCOUNTER — Telehealth: Payer: Self-pay | Admitting: Family Medicine

## 2015-03-06 MED ORDER — PREDNISONE 20 MG PO TABS: ORAL_TABLET | ORAL | Status: DC

## 2015-03-06 NOTE — Telephone Encounter (Signed)
Pt is needing prednisone called in for poison oak.   [facility]

## 2015-03-06 NOTE — Telephone Encounter (Signed)
Patient notified

## 2015-03-06 NOTE — Telephone Encounter (Signed)
Sent in. Call back if worsens or persists.

## 2015-05-03 ENCOUNTER — Ambulatory Visit: Payer: Medicaid Other | Admitting: Nurse Practitioner

## 2015-08-10 ENCOUNTER — Ambulatory Visit (INDEPENDENT_AMBULATORY_CARE_PROVIDER_SITE_OTHER): Payer: Medicaid Other | Admitting: Nurse Practitioner

## 2015-08-10 ENCOUNTER — Encounter: Payer: Self-pay | Admitting: Nurse Practitioner

## 2015-08-10 VITALS — BP 126/88 | Temp 98.2°F | Ht 69.5 in | Wt 204.4 lb

## 2015-08-10 DIAGNOSIS — J209 Acute bronchitis, unspecified: Secondary | ICD-10-CM | POA: Diagnosis not present

## 2015-08-10 DIAGNOSIS — J0110 Acute frontal sinusitis, unspecified: Secondary | ICD-10-CM | POA: Diagnosis not present

## 2015-08-10 MED ORDER — PREDNISONE 20 MG PO TABS: ORAL_TABLET | ORAL | Status: DC

## 2015-08-10 MED ORDER — HYDROCODONE-HOMATROPINE 5-1.5 MG/5ML PO SYRP: 5.0000 mL | ORAL_SOLUTION | ORAL | Status: DC | PRN

## 2015-08-10 MED ORDER — AMOXICILLIN-POT CLAVULANATE 875-125 MG PO TABS: 1.0000 | ORAL_TABLET | Freq: Two times a day (BID) | ORAL | Status: DC

## 2015-08-10 MED ORDER — METHYLPREDNISOLONE ACETATE 40 MG/ML IJ SUSP
40.0000 mg | Freq: Once | INTRAMUSCULAR | Status: AC
  Administered 2015-08-10: 40 mg via INTRAMUSCULAR

## 2015-08-13 ENCOUNTER — Encounter: Payer: Self-pay | Admitting: Nurse Practitioner

## 2015-08-13 NOTE — Progress Notes (Signed)
Subjective:  Presents for c/o frontal area headache, left ear pain, cough x 1 week. Fever resolved. Sore throat. Green mucus. Runny nose. Frequent non productive cough. No wheezing.   Objective:   BP 126/88 mmHg  Temp(Src) 98.2 F (36.8 C) (Oral)  Ht 5' 9.5" (1.765 m)  Wt 204 lb 6 oz (92.704 kg)  BMI 29.76 kg/m2 NAD. Alert, oriented. TMs clear effusion. Pharynx injected with PND noted. Neck supple with mild anterior adenopathy. Lungs scattered faint expiratory crackles. Heart RRR.   Assessment: Acute frontal sinusitis, recurrence not specified - Plan: methylPREDNISolone acetate (DEPO-MEDROL) injection 40 mg  Acute bronchitis, unspecified organism - Plan: methylPREDNISolone acetate (DEPO-MEDROL) injection 40 mg  Plan:  Meds ordered this encounter  Medications  . predniSONE (DELTASONE) 20 MG tablet    Sig: 3 po qd x 3 d then 2 po qd x 3 d then 1 po qd x 3 d    Dispense:  18 tablet    Refill:  0    Order Specific Question:  Supervising Provider    Answer:  Mikey Kirschner [[id number]]  . amoxicillin-clavulanate (AUGMENTIN) 875-125 MG tablet    Sig: Take 1 tablet by mouth 2 (two) times daily.    Dispense:  20 tablet    Refill:  0    Order Specific Question:  Supervising Provider    Answer:  Mikey Kirschner [[id number]]  . HYDROcodone-homatropine (HYCODAN) 5-1.5 MG/5ML syrup    Sig: Take 5 mLs by mouth every 4 (four) hours as needed.    Dispense:  120 mL    Refill:  0    Order Specific Question:  Supervising Provider    Answer:  Mikey Kirschner [[id number]]  . methylPREDNISolone acetate (DEPO-MEDROL) injection 40 mg    Sig:    OTC meds as directed. Call back if worsens or persists.

## 2015-09-25 ENCOUNTER — Other Ambulatory Visit: Payer: Self-pay | Admitting: Nurse Practitioner

## 2015-09-25 MED ORDER — MOMETASONE FUROATE 50 MCG/ACT NA SUSP: 2.0000 | Freq: Every day | NASAL | Status: DC

## 2015-09-25 MED ORDER — LORATADINE 10 MG PO TABS: 10.0000 mg | ORAL_TABLET | Freq: Every day | ORAL | Status: DC

## 2015-12-07 ENCOUNTER — Telehealth: Payer: Self-pay | Admitting: Nurse Practitioner

## 2015-12-07 MED ORDER — AMOXICILLIN 500 MG PO CAPS: 500.0000 mg | ORAL_CAPSULE | Freq: Three times a day (TID) | ORAL | Status: DC

## 2015-12-07 NOTE — Telephone Encounter (Signed)
°  Mom has picked up symptoms from [place] that was seen Monday by  Hoyle Sauer. She has the same symptoms now and wants to have something  like a zpak called in for herself if possible    [facility]

## 2015-12-07 NOTE — Telephone Encounter (Signed)
Patient has nasal/head congestion (green colored), drainage, sinus pressure, headache and dry cough. No wheezing, sob or fever.

## 2015-12-07 NOTE — Telephone Encounter (Signed)
Notified patient per Dr. Nicki Reaper- to use OTC allergy and sinus meds, Amoxil 500 mg 1 TID x 10 days. Med sent to pharmacy. Patient verbalized understanding.

## 2016-04-03 ENCOUNTER — Encounter: Payer: Self-pay | Admitting: Family Medicine

## 2016-05-05 ENCOUNTER — Encounter: Payer: Self-pay | Admitting: Family Medicine

## 2016-05-05 ENCOUNTER — Telehealth: Payer: Self-pay | Admitting: Family Medicine

## 2016-05-05 ENCOUNTER — Other Ambulatory Visit: Payer: Self-pay | Admitting: Nurse Practitioner

## 2016-05-05 MED ORDER — IBUPROFEN 800 MG PO TABS: 800.0000 mg | ORAL_TABLET | Freq: Three times a day (TID) | ORAL | 0 refills | Status: DC | PRN

## 2016-05-05 NOTE — Telephone Encounter (Signed)
Patient requesting Rx for Motrin 800 mg for stomach cramps.   [facility] [place]

## 2016-07-01 ENCOUNTER — Other Ambulatory Visit: Payer: Self-pay | Admitting: Nurse Practitioner

## 2016-07-01 MED ORDER — ALPRAZOLAM 0.5 MG PO TABS: ORAL_TABLET | ORAL | 2 refills | Status: DC

## 2016-07-30 ENCOUNTER — Encounter: Payer: Self-pay | Admitting: Family Medicine

## 2016-07-30 ENCOUNTER — Ambulatory Visit (INDEPENDENT_AMBULATORY_CARE_PROVIDER_SITE_OTHER): Payer: PRIVATE HEALTH INSURANCE | Admitting: Family Medicine

## 2016-07-30 VITALS — BP 120/70 | Temp 97.8°F | Ht 69.5 in | Wt 196.1 lb

## 2016-07-30 DIAGNOSIS — J029 Acute pharyngitis, unspecified: Secondary | ICD-10-CM | POA: Diagnosis not present

## 2016-07-30 LAB — POCT RAPID STREP A (OFFICE): RAPID STREP A SCREEN: NEGATIVE

## 2016-07-30 MED ORDER — CLARITHROMYCIN 500 MG PO TABS: 500.0000 mg | ORAL_TABLET | Freq: Two times a day (BID) | ORAL | 0 refills | Status: DC

## 2016-07-30 MED ORDER — HYDROCODONE-HOMATROPINE 5-1.5 MG/5ML PO SYRP: ORAL_SOLUTION | ORAL | 0 refills | Status: DC

## 2016-07-30 NOTE — Progress Notes (Signed)
   Subjective:    Patient ID: Emma Johnson, female    DOB: 09-01-1975, 40 y.o.   MRN: [id number]  Sore Throat   This is a new problem. The current episode started yesterday. The problem has been unchanged. Neither side of throat is experiencing more pain than the other. The maximum temperature recorded prior to her arrival was 100.4 - 100.9 F. The pain is moderate. Associated symptoms include neck pain. She has tried acetaminophen for the symptoms. The treatment provided no relief.   Patient has no other concerns at this time.   Felt very bad right off the bat  Took tylenol and motrin, felt horrible  ahcey in the muscles   Tired  Headache feels pressure  Appetite ok   Dry cough significant  throa achey and hurting  Pt has other family members with this  Results for orders placed or performed in visit on 09/12/14  TB Skin Test  Result Value Ref Range   TB Skin Test Negative    Induration 0 mm     Review of Systems  Musculoskeletal: Positive for neck pain.       Objective:   Physical Exam Alert vitals stable mild malaise. Pharynx some erythema tender anterior nodes neck supple intermittent cough during exam lungs clear heart rare rhythm.       Assessment & Plan:  Impression viral syndrome with substantial strep in the family. Even though some this may be strep [place] only discussed plan antibiotics prescribed. Symptom care discussed

## 2016-07-31 LAB — STREP A DNA PROBE: Strep Gp A Direct, DNA Probe: NEGATIVE

## 2016-11-18 ENCOUNTER — Ambulatory Visit (INDEPENDENT_AMBULATORY_CARE_PROVIDER_SITE_OTHER): Payer: PRIVATE HEALTH INSURANCE | Admitting: Family Medicine

## 2016-11-18 ENCOUNTER — Encounter: Payer: Self-pay | Admitting: Family Medicine

## 2016-11-18 VITALS — Ht 69.5 in | Wt 196.0 lb

## 2016-11-18 DIAGNOSIS — F4321 Adjustment disorder with depressed mood: Secondary | ICD-10-CM

## 2016-11-18 DIAGNOSIS — F321 Major depressive disorder, single episode, moderate: Secondary | ICD-10-CM | POA: Diagnosis not present

## 2016-11-18 DIAGNOSIS — F411 Generalized anxiety disorder: Secondary | ICD-10-CM

## 2016-11-18 DIAGNOSIS — F4320 Adjustment disorder, unspecified: Secondary | ICD-10-CM

## 2016-11-18 MED ORDER — CLONAZEPAM 1 MG PO TABS: ORAL_TABLET | ORAL | 2 refills | Status: DC

## 2016-11-18 MED ORDER — ESCITALOPRAM OXALATE 10 MG PO TABS: 10.0000 mg | ORAL_TABLET | ORAL | 2 refills | Status: DC

## 2016-11-18 NOTE — Progress Notes (Signed)
   Subjective:    Patient ID: Emma Johnson, female    DOB: 1976-06-23, 41 y.o.   MRN: [id number]  Anxiety  Presents for follow-up visit. Primary symptoms comment: nephew passed away. taking xanax. not helping with anxiety.     feeling very down, dpressed  ng, not sleeping well  Running tired, had very bad day yest  Month since nephew tragiclly passed away   9 yo passed away sudenly withopioid overdose  Patient does note a prior history of depression and anxiety. She has been on medication for this in the past.  She was very close to her nephew considered him almost like another son.  He died suddenly with an opioid overdose over a month ago.  Patient cannot strike stop crying., She's feeling very down. Notes no suicidal thoughts. No homicidal thoughts. Next  Having substantial difficulty sleeping. Notes anxiety at times. At times has near panic type attacks.   Review of Systems No headache, no major weight loss or weight gain, no chest pain no back pain abdominal pain no change in bowel habits complete ROS otherwise negative     Objective:   Physical Exam Alert and oriented, vitals reviewed and stable, Tearful, depressed affect. ENT-TM's and ext canals WNL bilat via otoscopic exam Soft palate, tonsils and post pharynx WNL via oropharyngeal exam Neck-symmetric, no masses; thyroid nonpalpable and nontender Pulmonary-no tachypnea or accessory muscle use; Clear without wheezes via auscultation Card--no abnrml murmurs, rhythm reg and rate WNL Carotid pulses symmetric, without bruits        Assessment & Plan:  Impression grief with substantial flare of background depression and anxiety. Discussed at great length. Petra Kuba of grief and loss discussed, particularly when it involves a member of a younger generation such as a nephew. Plan will initiate Lexapro. Recommend psychological counseling. Rationale discussed. Exercise encourage. Warning signs discussed carefully.  Nocturnal benzodiazepine when necessary for sleep.  Greater than 50% of this 25 minute face to face visit was spent in counseling and discussion and coordination of care regarding the above diagnosis/diagnosies

## 2016-12-02 ENCOUNTER — Telehealth (HOSPITAL_COMMUNITY): Payer: Self-pay | Admitting: *Deleted

## 2016-12-02 NOTE — Telephone Encounter (Signed)
phone call, left voice message regarding an appointment. 

## 2016-12-09 ENCOUNTER — Telehealth: Payer: Self-pay | Admitting: Family Medicine

## 2016-12-09 ENCOUNTER — Encounter: Payer: Self-pay | Admitting: Family Medicine

## 2016-12-09 NOTE — Telephone Encounter (Signed)
Requesting work excuse for last night to return tonight.  She said she did not have a good day yesterday and she had an episode.

## 2016-12-09 NOTE — Telephone Encounter (Signed)
Note completed, notified patient to pick up.

## 2016-12-09 NOTE — Telephone Encounter (Signed)
She may have note as requested

## 2016-12-15 ENCOUNTER — Encounter: Payer: Self-pay | Admitting: Family Medicine

## 2016-12-15 ENCOUNTER — Telehealth: Payer: Self-pay | Admitting: Family Medicine

## 2016-12-15 NOTE — Telephone Encounter (Signed)
Pt is requesting a work excuse for the days she was out due to her nephews death. Pt was out of work on 2022-11-14,3/12,3/13,3/14 and 3/20. Pt states that her employer is trying to write her up for missing work. Please advise.

## 2016-12-15 NOTE — Telephone Encounter (Signed)
ok 

## 2016-12-15 NOTE — Telephone Encounter (Signed)
Pt was notified that note is at the front desk.

## 2016-12-31 ENCOUNTER — Encounter: Payer: Self-pay | Admitting: Family Medicine

## 2016-12-31 ENCOUNTER — Ambulatory Visit (INDEPENDENT_AMBULATORY_CARE_PROVIDER_SITE_OTHER): Payer: PRIVATE HEALTH INSURANCE | Admitting: Family Medicine

## 2016-12-31 VITALS — BP 118/78 | Ht 70.0 in | Wt 195.1 lb

## 2016-12-31 DIAGNOSIS — F411 Generalized anxiety disorder: Secondary | ICD-10-CM

## 2016-12-31 DIAGNOSIS — F4320 Adjustment disorder, unspecified: Secondary | ICD-10-CM | POA: Diagnosis not present

## 2016-12-31 DIAGNOSIS — F4321 Adjustment disorder with depressed mood: Secondary | ICD-10-CM

## 2016-12-31 MED ORDER — CLONAZEPAM 1 MG PO TABS: ORAL_TABLET | ORAL | 3 refills | Status: DC

## 2016-12-31 MED ORDER — ESCITALOPRAM OXALATE 20 MG PO TABS: 20.0000 mg | ORAL_TABLET | Freq: Every day | ORAL | 5 refills | Status: DC

## 2016-12-31 NOTE — Progress Notes (Signed)
   Subjective:    Patient ID: Emma Johnson, female    DOB: Apr 26, 1976, 41 y.o.   MRN: [id number]  Anxiety  Presents for follow-up visit.    Patient in today for a 6 week follow up on Anxiety. States no other concerns this visit.   Did attend counseling through work. Helped a little but not a lot. States she feels it just needs more time.  Notes ongoing periods of irritability and anger. Periods of feeling down crying spells. Wonders if she can take higher dose on the Lexapro.  Using the clonazepam daily primarily for anxiety. Next  No suicidal thoughts  Review of Systems No headache, no major weight loss or weight gain, no chest pain no back pain abdominal pain no change in bowel habits complete ROS otherwise negative     Objective:   Physical Exam  Alert vitals stable, NAD. Blood pressure good on repeat. HEENT normal. Lungs clear. Heart regular rate and rhythm.       Assessment & Plan:  Impression ongoing depression/anxiety primarily due to grief. It's now been 2 months since patient's nephew passed away exercise strongly encouraged. Increase Lexapro to 20. Maintain other meds recheck in 4 months

## 2017-01-16 ENCOUNTER — Other Ambulatory Visit: Payer: Self-pay | Admitting: Nurse Practitioner

## 2017-01-30 ENCOUNTER — Encounter: Payer: Self-pay | Admitting: Family Medicine

## 2017-02-03 ENCOUNTER — Ambulatory Visit: Payer: PRIVATE HEALTH INSURANCE | Admitting: Family Medicine

## 2017-02-06 ENCOUNTER — Other Ambulatory Visit: Payer: Self-pay | Admitting: Nurse Practitioner

## 2017-04-07 ENCOUNTER — Ambulatory Visit (INDEPENDENT_AMBULATORY_CARE_PROVIDER_SITE_OTHER): Payer: PRIVATE HEALTH INSURANCE | Admitting: Family Medicine

## 2017-04-07 ENCOUNTER — Encounter: Payer: Self-pay | Admitting: Family Medicine

## 2017-04-07 VITALS — BP 120/80 | Ht 70.0 in | Wt 196.2 lb

## 2017-04-07 DIAGNOSIS — S76311A Strain of muscle, fascia and tendon of the posterior muscle group at thigh level, right thigh, initial encounter: Secondary | ICD-10-CM

## 2017-04-07 MED ORDER — CHLORZOXAZONE 500 MG PO TABS: 500.0000 mg | ORAL_TABLET | Freq: Four times a day (QID) | ORAL | 0 refills | Status: DC | PRN

## 2017-04-07 MED ORDER — DICLOFENAC SODIUM 75 MG PO TBEC: 75.0000 mg | DELAYED_RELEASE_TABLET | Freq: Two times a day (BID) | ORAL | 0 refills | Status: DC

## 2017-04-07 NOTE — Progress Notes (Signed)
   Subjective:    Patient ID: Emma Johnson, female    DOB: [birth year], 41 y.o.   MRN: [id number]  Leg Pain   The incident occurred more than 1 week ago. The injury mechanism was a fall. The pain is present in the right thigh and right leg. She has tried acetaminophen and NSAIDs for the symptoms.  Patient accidentally fell in an off of steps landing on some bushes injuring her leg and back she relates severe pain in the hamstring muscle. Denies calf pain. Denies any other troubles. No numbness or tingling. This happened 2 days ago.  Patient states no other concerns this visit.   Review of Systems Please see above. Denies wheezing cough fever chills vomiting diarrhea    Objective:   Physical Exam  low back mild tenderness hamstring tightness noted on the right side limited in walking because a limping in her hamstring.   15 minutes spent in patient greater than half in discussion    Assessment & Plan:  Hamstring strain-stretching exercises recommended In addition to this patient has prescription for diclofenac called in use this in place of the ibuprofen one twice daily over the next 7 to 12 days muscle relaxers for home use caution drowsiness if ongoing troubles let us know  Exercise printout was given for stretching of the hamstrings. If not doing better over the next week physical therapy patient will

## 2017-05-01 ENCOUNTER — Ambulatory Visit: Payer: PRIVATE HEALTH INSURANCE | Admitting: Family Medicine

## 2017-05-04 ENCOUNTER — Encounter: Payer: Self-pay | Admitting: Family Medicine

## 2017-06-30 ENCOUNTER — Encounter: Payer: Self-pay | Admitting: Family Medicine

## 2017-09-22 ENCOUNTER — Encounter: Payer: Self-pay | Admitting: Family Medicine

## 2017-09-22 ENCOUNTER — Ambulatory Visit (INDEPENDENT_AMBULATORY_CARE_PROVIDER_SITE_OTHER): Payer: Managed Care, Other (non HMO) | Admitting: Family Medicine

## 2017-09-22 VITALS — BP 126/74 | Temp 98.8°F | Ht 70.0 in | Wt 200.8 lb

## 2017-09-22 DIAGNOSIS — J111 Influenza due to unidentified influenza virus with other respiratory manifestations: Secondary | ICD-10-CM

## 2017-09-22 DIAGNOSIS — J329 Chronic sinusitis, unspecified: Secondary | ICD-10-CM

## 2017-09-22 MED ORDER — CEFDINIR 300 MG PO CAPS: 300.0000 mg | ORAL_CAPSULE | Freq: Two times a day (BID) | ORAL | 0 refills | Status: DC

## 2017-09-22 MED ORDER — HYDROCODONE-HOMATROPINE 5-1.5 MG/5ML PO SYRP: ORAL_SOLUTION | ORAL | 0 refills | Status: DC

## 2017-09-22 NOTE — Progress Notes (Signed)
   Subjective:    Patient ID: Emma Johnson, female    DOB: 1976/07/06, 42 y.o.   MRN: [id number]  Sinusitis  This is a new problem. Episode onset: 4 days. (Fever, body aches, headache, runny nose, dry cough, sore throat) Treatments tried: prednisone injection, dukes magic mouthwash from urgent care.   fri hit hard out of no where  Got real achey and pain and neck pain  Scalp hurt then,   fri night 101.9   Then subsequent cough and headache, bad  Went to the ER, [facility], went to [facility] neg for streop   Given pred shot and dukes m m w  Yesterday body ache and back pain and so bad feling and achey     Throat was inflammed     Review of Systems No headache, no major weight loss or weight gain, no chest pain no back pain abdominal pain no change in bowel habits complete ROS otherwise negative     Objective:   Physical Exam  Alert, mild malaise. Hydration good Vitals stable. frontal/ maxillary tenderness evident positive nasal congestion. pharynx normal neck supple  lungs clear/no crackles or wheezes. heart regular in rhythm       Assessment & Plan:  Impression rhinosinusitis likely post viral, discussed with patient. plan antibiotics prescribed. Questions answered. Symptomatic care discussed. warning signs discussed. WSL Likely post flu discussed

## 2017-10-28 ENCOUNTER — Other Ambulatory Visit: Payer: Self-pay | Admitting: Nurse Practitioner

## 2017-10-28 MED ORDER — IBUPROFEN 800 MG PO TABS: 800.0000 mg | ORAL_TABLET | Freq: Three times a day (TID) | ORAL | 0 refills | Status: DC | PRN

## 2017-11-16 ENCOUNTER — Encounter: Payer: Self-pay | Admitting: Nurse Practitioner

## 2017-11-16 ENCOUNTER — Ambulatory Visit (INDEPENDENT_AMBULATORY_CARE_PROVIDER_SITE_OTHER): Payer: Managed Care, Other (non HMO) | Admitting: Nurse Practitioner

## 2017-11-16 ENCOUNTER — Encounter: Payer: Self-pay | Admitting: Family Medicine

## 2017-11-16 VITALS — BP 102/68 | Temp 98.0°F | Ht 69.5 in | Wt 198.0 lb

## 2017-11-16 DIAGNOSIS — L0291 Cutaneous abscess, unspecified: Secondary | ICD-10-CM | POA: Diagnosis not present

## 2017-11-16 MED ORDER — DOXYCYCLINE HYCLATE 100 MG PO TABS: 100.0000 mg | ORAL_TABLET | Freq: Two times a day (BID) | ORAL | 0 refills | Status: DC

## 2017-11-16 NOTE — Progress Notes (Signed)
Subjective:  Presents for c/o a "cyst" in the upper inner right leg for the past couple of months. Recently it became infected and enlarged. Two days ago the area ruptured and a large amount of purulent material drained out. Has also applied warm compresses. Has taken 3 doses of Doxycycline her husband had left over. No fever.   Objective:   BP 102/68   Temp 98 F (36.7 C) (Oral)   Ht 5' 9.5" (1.765 m)   Wt 198 lb 0.2 oz (89.8 kg)   BMI 28.82 kg/m  NAD. Alert, oriented. Flat slightly pink area on the upper inner right thigh near the intertriginous area. A small healing open area noted in the center, no active drainage. No mass. Slightly tender to palpation. One small inguinal lymph node noted.   Assessment:  Abscess resolving   Plan:   Meds ordered this encounter  Medications  . doxycycline (VIBRA-TABS) 100 MG tablet    Sig: Take 1 tablet (100 mg total) by mouth 2 (two) times daily.    Dispense:  14 tablet    Refill:  0    Order Specific Question:   Supervising Provider    Answer:   Mikey Kirschner [[id number]]   Continue Doxycyline and warm compresses. Expect continued resolution. Call back by the end of the week if no improvement, sooner if worse.

## 2018-02-04 ENCOUNTER — Encounter: Payer: Self-pay | Admitting: Family Medicine

## 2018-02-25 ENCOUNTER — Ambulatory Visit (INDEPENDENT_AMBULATORY_CARE_PROVIDER_SITE_OTHER): Payer: Managed Care, Other (non HMO) | Admitting: Nurse Practitioner

## 2018-02-25 ENCOUNTER — Encounter: Payer: Self-pay | Admitting: Nurse Practitioner

## 2018-02-25 VITALS — BP 118/82 | Ht 69.5 in | Wt 197.6 lb

## 2018-02-25 DIAGNOSIS — F411 Generalized anxiety disorder: Secondary | ICD-10-CM | POA: Diagnosis not present

## 2018-02-25 MED ORDER — CLONAZEPAM 1 MG PO TABS: ORAL_TABLET | ORAL | 3 refills | Status: DC

## 2018-02-25 MED ORDER — ESCITALOPRAM OXALATE 20 MG PO TABS: 20.0000 mg | ORAL_TABLET | Freq: Every day | ORAL | 5 refills | Status: DC

## 2018-02-26 ENCOUNTER — Encounter: Payer: Self-pay | Admitting: Nurse Practitioner
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
## 2018-03-22 ENCOUNTER — Telehealth: Payer: Self-pay | Admitting: Obstetrics and Gynecology

## 2018-03-22 NOTE — Telephone Encounter (Signed)
Patient called stating that she has been experiencing pain on her left side. Pt states it her ovary and her uterus. Pt has a new patient appointment scheduled on 8/19. Please contact pt

## 2018-03-22 NOTE — Telephone Encounter (Signed)
Patient states she had D&C with ablation about 5 years ago.  Over the last 4 months, she is having right sided pain with nausea and vomiting. Has appt on 8/19 but wanting to be seen sooner if possible.  Informed patient we did not have any openings this week as we had a provider out on vacation but would call if any appt became available.  Advised to continue taking Ibuprofen for pain and heating pad as well. Verbalized understanding and agreeable to plan.

## 2018-03-29 ENCOUNTER — Other Ambulatory Visit (HOSPITAL_COMMUNITY)
Admission: RE | Admit: 2018-03-29 | Discharge: 2018-03-29 | Disposition: A | Discharge status: Home / Self Care | Payer: Managed Care, Other (non HMO) | Source: Ambulatory Visit | Attending: Obstetrics and Gynecology | Admitting: Obstetrics and Gynecology

## 2018-03-29 ENCOUNTER — Encounter: Payer: Self-pay | Admitting: Obstetrics and Gynecology

## 2018-03-29 ENCOUNTER — Ambulatory Visit (INDEPENDENT_AMBULATORY_CARE_PROVIDER_SITE_OTHER): Payer: Managed Care, Other (non HMO) | Admitting: Obstetrics and Gynecology

## 2018-03-29 VITALS — BP 109/63 | HR 86 | Ht 69.2 in | Wt 196.0 lb

## 2018-03-29 DIAGNOSIS — N946 Dysmenorrhea, unspecified: Secondary | ICD-10-CM

## 2018-03-29 DIAGNOSIS — Z01419 Encounter for gynecological examination (general) (routine) without abnormal findings: Secondary | ICD-10-CM

## 2018-03-29 NOTE — Patient Instructions (Signed)
Hysterectomy Information A hysterectomy is a surgery to remove your uterus. After surgery, you will no longer have periods. Also, you will not be able to get pregnant. Reasons for this surgery  You have bleeding that is not normal and keeps coming back.  You have lasting (chronic) lower belly (pelvic) pain.  You have a lasting infection.  The lining of your uterus grows outside your uterus.  The lining of your uterus grows in the muscle of your uterus.  Your uterus falls down into your vagina.  You have a growth in your uterus that causes problems.  You have cells that could turn into cancer (precancerous cells).  You have cancer of the uterus or cervix. Types There are 3 types of hysterectomies. Depending on the type, the surgery will:  Remove the top part of the uterus only.  Remove the uterus and the cervix.  Remove the uterus, cervix, and tissue that holds the uterus in place in the lower belly.  Ways a hysterectomy can be performed There are 5 ways this surgery can be performed.  A cut (incision) is made in the belly (abdomen). The uterus is taken out through the cut.  A cut is made in the vagina. The uterus is taken out through the cut.  Three or four cuts are made in the belly. A surgical device with a camera is put through one of the cuts. The uterus is cut into small pieces. The uterus is taken out through the cuts or the vagina.  Three or four cuts are made in the belly. A surgical device with a camera is put through one of the cuts. The uterus is taken out through the vagina.  Three or four cuts are made in the belly. A surgical device that is controlled by a computer makes a visual image. The device helps the surgeon control the surgical tools. The uterus is cut into small pieces. The pieces are taken out through the cuts or through the vagina.  What can I expect after the surgery?  You will be given pain medicine.  You will need help at home for 3-5 days  after surgery.  You will need to see your doctor in 2-4 weeks after surgery.  You may get hot flashes, have night sweats, and have trouble sleeping.  You may need to have Pap tests in the future if your surgery was related to cancer. Talk to your doctor. It is still good to have regular exams. This information is not intended to replace advice given to you by your health care provider. Make sure you discuss any questions you have with your health care provider. Document Released: 10/20/2011 Document Revised: 01/03/2016 Document Reviewed: 04/04/2013 Elsevier Interactive Patient Education  2018 Elsevier Inc.  

## 2018-03-29 NOTE — Progress Notes (Signed)
[facility] Clinic Visit  @DATE @            Patient name: Emma Johnson MRN [id number]  Date of birth: 1976-01-14  CC & HPI:  Emma Johnson is a 42 y.o. female presenting today for right side pain, had an ablation 5 years ago. 2 years ago every time she would get her period she would get PMS symptoms. She bleed brown blood instead of fresh blood. Her daughter just turned 6 and has had 2 "real periods" in the last year it is interfering with her life. Her last periods began 10 days ago and describes her periods as "gushing" when she gets them at the beginning of the month. The pain causes her to vomit, and wake up during the night with pain that comes down her side and into her right leg.   She has 2 children both delivered vaginally and has not her tunes tied, her partner had a vasectomy. Denies any bladder issues, she belives she has bowel movements better when her period begins, and has no bowel trouble when not on her period.   ROS:  ROS +LRQ pain +dysmenorrhea  All systems are negative except as noted in the HPI and PMH.  Pertinent History Reviewed:   Reviewed: Significant for Ablation Medical         Past Medical History:  Diagnosis Date  . AMA (advanced maternal age) multigravida 57+   . Depression   . Fibroids   . No pertinent past medical history                               Surgical Hx:    Past Surgical History:  Procedure Laterality Date  . DILITATION & CURRETTAGE/HYSTROSCOPY WITH THERMACHOICE ABLATION N/A 05/18/2013   Procedure: DILATATION & CURETTAGE/HYSTEROSCOPY WITH THERMACHOICE ABLATION (Total Therapy Time=17minutes);  Surgeon: Florian Buff, MD;  Location: [facility];  Service: Gynecology;  Laterality: N/A;  . POLYPECTOMY N/A 05/18/2013   Procedure: ENDOMETRIAL POLYPECTOMY;  Surgeon: Florian Buff, MD;  Location: [facility];  Service: Gynecology;  Laterality: N/A;   Medications: Reviewed & Updated - see associated section                       Current  Outpatient Medications:  .  clonazePAM (KLONOPIN) 1 MG tablet, Take 1/2 -1 pill po BID prn anxiety, Disp: 30 tablet, Rfl: 3 .  ibuprofen (ADVIL,MOTRIN) 800 MG tablet, Take 1 tablet (800 mg total) by mouth every 8 (eight) hours as needed., Disp: 30 tablet, Rfl: 0 .  diclofenac (VOLTAREN) 75 MG EC tablet, Take 1 tablet (75 mg total) by mouth 2 (two) times daily. (Patient not taking: Reported on 11/16/2017), Disp: 30 tablet, Rfl: 0 .  doxycycline (VIBRA-TABS) 100 MG tablet, Take 1 tablet (100 mg total) by mouth 2 (two) times daily. (Patient not taking: Reported on 02/25/2018), Disp: 14 tablet, Rfl: 0 .  escitalopram (LEXAPRO) 20 MG tablet, Take 1 tablet (20 mg total) by mouth daily. (Patient not taking: Reported on 03/29/2018), Disp: 30 tablet, Rfl: 5   Social History: Reviewed -  reports that she has been smoking cigarettes. She has a 10.00 pack-year smoking history. She has never used smokeless tobacco.  Objective Findings:  Vitals: Blood pressure 109/63, pulse 86, height 5' 9.2" (1.758 m), weight 196 lb (88.9 kg), last menstrual period 03/18/2018.  PHYSICAL EXAMINATION General appearance - alert, well appearing, and  in no distress and oriented to person, place, and time Mental status - alert, oriented to person, place, and time, normal mood, behavior, speech, dress, motor activity, and thought processes, affect appropriate to mood  PELVIC Vagina - 1st degree uterine descensus Cervix - multip Uterus -   moderate support Rectal-  Slight rectocele relaxed introutus PAP: Pap smear done today, thin-prep method.  Assessment & Plan:   A:  1.  Dysmenorrhea  P:  1.  F/u in 2 weeks for transvaginal u/s   By signing my name below, I, Samul Dada, attest that this documentation has been prepared under the direction and in the presence of Jonnie Kind, MD. Electronically Signed: [place]. 03/29/18. 9:02 AM.  I personally performed the services described in this  documentation, which was SCRIBED in my presence. The recorded information has been reviewed and considered accurate. It has been edited as necessary during review. Jonnie Kind, MD

## 2018-03-30 LAB — CYTOLOGY - PAP
DIAGNOSIS: NEGATIVE
HPV: NOT DETECTED

## 2018-04-14 ENCOUNTER — Other Ambulatory Visit: Payer: Self-pay | Admitting: Obstetrics and Gynecology

## 2018-04-14 ENCOUNTER — Other Ambulatory Visit: Payer: Managed Care, Other (non HMO)

## 2018-04-14 DIAGNOSIS — N946 Dysmenorrhea, unspecified: Secondary | ICD-10-CM

## 2018-04-15 ENCOUNTER — Ambulatory Visit (INDEPENDENT_AMBULATORY_CARE_PROVIDER_SITE_OTHER): Payer: Managed Care, Other (non HMO)

## 2018-04-15 ENCOUNTER — Other Ambulatory Visit: Payer: Self-pay | Admitting: Obstetrics and Gynecology

## 2018-04-15 ENCOUNTER — Encounter: Payer: Self-pay | Admitting: Obstetrics and Gynecology

## 2018-04-15 ENCOUNTER — Ambulatory Visit (INDEPENDENT_AMBULATORY_CARE_PROVIDER_SITE_OTHER): Payer: Managed Care, Other (non HMO) | Admitting: Obstetrics and Gynecology

## 2018-04-15 ENCOUNTER — Telehealth: Payer: Self-pay | Admitting: Obstetrics and Gynecology

## 2018-04-15 VITALS — BP 114/57 | HR 76 | Ht 69.0 in | Wt 199.0 lb

## 2018-04-15 DIAGNOSIS — R102 Pelvic and perineal pain: Secondary | ICD-10-CM | POA: Diagnosis not present

## 2018-04-15 DIAGNOSIS — N946 Dysmenorrhea, unspecified: Secondary | ICD-10-CM

## 2018-04-15 DIAGNOSIS — N83202 Unspecified ovarian cyst, left side: Secondary | ICD-10-CM

## 2018-04-15 MED ORDER — TRAMADOL HCL 50 MG PO TABS: 50.0000 mg | ORAL_TABLET | Freq: Four times a day (QID) | ORAL | 0 refills | Status: DC | PRN

## 2018-04-15 MED ORDER — NORETHINDRONE 0.35 MG PO TABS: 1.0000 | ORAL_TABLET | Freq: Every day | ORAL | 11 refills | Status: DC

## 2018-04-15 NOTE — Progress Notes (Signed)
Tramadol escribed

## 2018-04-15 NOTE — Telephone Encounter (Signed)
LMOVM that medication will be faxed to pharmacy.

## 2018-04-15 NOTE — Telephone Encounter (Signed)
Patient called stating that she came into the office today and one of her medication got to the pharmacy but her tramadol did not. Pt would like for [place] to refax it, please contact pt when done.

## 2018-04-15 NOTE — Progress Notes (Signed)
Patient ID: [name], female   DOB: July 19, 1976, 42 y.o.   MRN: [id number]    [facility] Visit  @DATE @            Patient name: Emma Johnson MRN [id number]  Date of birth: [birth year]  CC & HPI:  Emma Johnson is a 42 y.o. female presenting today for a transvaginal US. She was seen on 03/29/2018 for dysmenorrhea s/p ablation. She described her periods as "gushing" and said that the pain causes her to vomit and wake up in the middle of the night with pain that comes down her side into her right leg.  Today she reports RLQ pain for the past 6 months. She takes 800mg  Ibuprofin daily because the pain is so bad. Today, she was feeling okay until the [place] and now she is sore. During the [place], her eyes watered because of the pain on the right side, but the rest of the [place] did not hurt. Her partner had a vasectomy and she does not have her tubes tied. Her last PAP on 03/29/2018 was normal. The patient denies fever, chills or any other symptoms or complaints at this time.   ROS:  ROS + RLQ pain - fever - chills All systems are negative except as noted in the HPI and PMH.   Pertinent History Reviewed:   Reviewed: Medical         Past Medical History:  Diagnosis Date  . AMA (advanced maternal age) multigravida 85+   . Depression   . Fibroids   . No pertinent past medical history                               Surgical Hx:    Past Surgical History:  Procedure Laterality Date  . DILITATION & CURRETTAGE/HYSTROSCOPY WITH THERMACHOICE ABLATION N/A 05/18/2013   Procedure: DILATATION & CURETTAGE/HYSTEROSCOPY WITH THERMACHOICE ABLATION (Total Therapy Time=31minutes);  Surgeon: Florian Buff, MD;  Location: [facility];  Service: Gynecology;  Laterality: N/A;  . POLYPECTOMY N/A 05/18/2013   Procedure: ENDOMETRIAL POLYPECTOMY;  Surgeon: Florian Buff, MD;  Location: [facility];  Service: Gynecology;  Laterality: N/A;   Medications: Reviewed & Updated - see associated section           Current Outpatient Medications:  .  clonazePAM (KLONOPIN) 1 MG tablet, Take 1/2 -1 pill po BID prn anxiety, Disp: 30 tablet, Rfl: 3 .  ibuprofen (ADVIL,MOTRIN) 800 MG tablet, Take 1 tablet (800 mg total) by mouth every 8 (eight) hours as needed., Disp: 30 tablet, Rfl: 0 .  diclofenac (VOLTAREN) 75 MG EC tablet, Take 1 tablet (75 mg total) by mouth 2 (two) times daily. (Patient not taking: Reported on 11/16/2017), Disp: 30 tablet, Rfl: 0 .  doxycycline (VIBRA-TABS) 100 MG tablet, Take 1 tablet (100 mg total) by mouth 2 (two) times daily. (Patient not taking: Reported on 02/25/2018), Disp: 14 tablet, Rfl: 0 .  escitalopram (LEXAPRO) 20 MG tablet, Take 1 tablet (20 mg total) by mouth daily. (Patient not taking: Reported on 03/29/2018), Disp: 30 tablet, Rfl: 5 .  norethindrone (MICRONOR,CAMILA,ERRIN) 0.35 MG tablet, Take 1 tablet (0.35 mg total) by mouth daily., Disp: 1 Package, Rfl: 11 .  traMADol (ULTRAM) 50 MG tablet, Take 1 tablet (50 mg total) by mouth every 6 (six) hours as needed for moderate pain or severe pain., Disp: 30 tablet, Rfl: 0  Social History: Reviewed -  reports that  she has been smoking cigarettes. She has a 10.00 pack-year smoking history. She has never used smokeless tobacco.  Objective Findings:  Vitals: Blood pressure (!) 114/57, pulse 76, height 5\' 9"  (1.753 m), weight 199 lb (90.3 kg), last menstrual period 03/18/2018.  PHYSICAL EXAMINATION General appearance - alert, well appearing, and in no distress, oriented to person, place, and time Mental status - alert, oriented to person, place, and time, normal mood, behavior, speech, dress, motor activity, and thought processes, affect appropriate to mood  PELVIC DEFERRED  Assessment & Plan:   A:  1. Transvaginal US today for dysmenorrhea s/p ablation, results reviewed with pt, 2. 8cc total volume right ovarian cyst 3. Asymptomatic simple left ovarian cyst 4. Thin endometrium w/ possible adenomyosis  5. Pelvic pain,  right side dominant  P:  1. Rx Micronor to suppress endometrium 2. Rx Tramadol prn Rlq pain 3.  Pt will Send note in 6 weeks via MyChart 4. F/U in 3 months if needed   By signing my name below, I, De Burrs, attest that this documentation has been prepared under the direction and in the presence of Jonnie Kind, MD. Electronically Signed: De Burrs, Medical Scribe. 04/15/18. 9:48 AM.  I personally performed the services described in this documentation, which was SCRIBED in my presence. The recorded information has been reviewed and considered accurate. It has been edited as necessary during review. Jonnie Kind, MD

## 2018-04-15 NOTE — Progress Notes (Signed)
PELVIC US TA/TV:heterogeneous anteverted uterus,irregular endometrium 17.6 mm,no color flow in endometrium,simple dominate follicle right ovary 1.9 x 1.5 x 1.7 cm,simple left ovarian cyst 4.5 x 3.6 x 4.3 cm w/arterial and venous flow,no free fluid,pelvic pain during ultrasound,ovaries appear mobile

## 2018-05-03 ENCOUNTER — Telehealth: Payer: Self-pay | Admitting: Obstetrics and Gynecology

## 2018-05-03 NOTE — Telephone Encounter (Signed)
Patient states for the last 3 weeks that she has been taking the Norethindrone, she has felt worse than before she started.  Nausea, pain on/off. Patient states she cannot keep going on like this.  She has missed 3 days of work due to the pain when walking/sitting, etc.  Most of the pain is on the right side.  She is unsure if she needs to give the medication more time or change to something else.    Requesting work note for 9/15 and 9/21.

## 2018-05-04 ENCOUNTER — Other Ambulatory Visit: Payer: Self-pay | Admitting: Obstetrics and Gynecology

## 2018-05-04 MED ORDER — TRAMADOL HCL 50 MG PO TABS: 50.0000 mg | ORAL_TABLET | Freq: Four times a day (QID) | ORAL | 0 refills | Status: DC | PRN

## 2018-05-10 ENCOUNTER — Encounter: Payer: Self-pay | Admitting: *Deleted

## 2018-05-10 ENCOUNTER — Encounter: Payer: Self-pay | Admitting: Family Medicine

## 2018-05-10 ENCOUNTER — Other Ambulatory Visit: Payer: Self-pay

## 2018-05-10 MED ORDER — IBUPROFEN 800 MG PO TABS: 800.0000 mg | ORAL_TABLET | Freq: Three times a day (TID) | ORAL | 1 refills | Status: DC | PRN

## 2018-06-04 ENCOUNTER — Other Ambulatory Visit: Payer: Self-pay | Admitting: *Deleted

## 2018-06-04 ENCOUNTER — Ambulatory Visit (INDEPENDENT_AMBULATORY_CARE_PROVIDER_SITE_OTHER): Payer: Managed Care, Other (non HMO) | Admitting: Family Medicine

## 2018-06-04 ENCOUNTER — Ambulatory Visit (HOSPITAL_COMMUNITY)
Admission: RE | Admit: 2018-06-04 | Discharge: 2018-06-04 | Disposition: A | Discharge status: Home / Self Care | Payer: Managed Care, Other (non HMO) | Source: Ambulatory Visit | Attending: Family Medicine | Admitting: Family Medicine

## 2018-06-04 ENCOUNTER — Telehealth: Payer: Self-pay | Admitting: *Deleted

## 2018-06-04 ENCOUNTER — Other Ambulatory Visit (HOSPITAL_COMMUNITY)
Admission: RE | Admit: 2018-06-04 | Discharge: 2018-06-04 | Disposition: A | Discharge status: Home / Self Care | Payer: Managed Care, Other (non HMO) | Source: Ambulatory Visit | Attending: Family Medicine | Admitting: Family Medicine

## 2018-06-04 ENCOUNTER — Encounter: Payer: Self-pay | Admitting: Family Medicine

## 2018-06-04 VITALS — BP 98/64 | Temp 98.8°F | Ht 69.0 in | Wt 196.0 lb

## 2018-06-04 DIAGNOSIS — I959 Hypotension, unspecified: Secondary | ICD-10-CM

## 2018-06-04 DIAGNOSIS — M79661 Pain in right lower leg: Secondary | ICD-10-CM

## 2018-06-04 DIAGNOSIS — F411 Generalized anxiety disorder: Secondary | ICD-10-CM | POA: Diagnosis not present

## 2018-06-04 DIAGNOSIS — R7989 Other specified abnormal findings of blood chemistry: Secondary | ICD-10-CM

## 2018-06-04 DIAGNOSIS — R079 Chest pain, unspecified: Secondary | ICD-10-CM | POA: Insufficient documentation

## 2018-06-04 LAB — D-DIMER, QUANTITATIVE: D-Dimer, Quant: 0.66 ug/mL-FEU — ABNORMAL HIGH (ref 0.00–0.50)

## 2018-06-04 NOTE — Telephone Encounter (Signed)
Requesting work note for last Friday the 18th. Was seen today.

## 2018-06-04 NOTE — Telephone Encounter (Signed)
Please give work excuse. 

## 2018-06-04 NOTE — Progress Notes (Signed)
   Subjective:    Patient ID: Emma Johnson, female    DOB: 1976-06-24, 42 y.o.   MRN: [id number]  HPIstarted on hormone therapy about one month ago and since starting med she has had heart fluttering. Pain in center to right side of chest.   Pt checked bp last two mornings 70/50, 80/58 last night 112/68. No energy.   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  Pt went on hormones "to avoid a hysterectomy"  Pt feels heart flutterm then followed by a tingy feeling,   Pain in the back of the knee  Earlier this sumer loked towrds getting on lexapro, but pt fdecided she did not want to take it, in fact never started, just ocaional    Smokes regulrly    Upper arm cuff uses at home    Review of Systems No headache, no major weight loss or weight gain, no chest pain no back pain abdominal pain no change in bowel habits complete ROS otherwise negative     Objective:   Physical Exam Alert and oriented, vitals reviewed and stable, NAD ENT-TM's and ext canals WNL bilat via otoscopic exam Soft palate, tonsils and post pharynx WNL via oropharyngeal exam Neck-symmetric, no masses; thyroid nonpalpable and nontender Pulmonary-no tachypnea or accessory muscle use; Clear without wheezes via auscultation Card--no abnrml murmurs, rhythm reg and rate WNL Carotid pulses symmetric, without bruits Right upper posterior calf tenderness deep palpation  Impression #1 right leg pain and tenderness and patient quite worried about a DVT.  Patient also is quite a smoker.  Will work-up to ascertain.  D-dimer ordered.  Ultrasound ordered.  Addendum ultrasound negative d-dimer pending  2.  Chest discomfort./Associated with palpitations.  EKG normal sinus rhythm, no significant ST-T changes, unremarkable.  Nature  of pain very atypical.  Patient very worried about this.  Will do cardiology referral to be on the safe side.  3.  Generalized anxiety disorder.  Had a frank discussion with patient in this regard.  I do think her anxiety plays into her physical symptomatology and I have encouraged her to reconsider getting back on something for that patient to consider   #4.  Low blood pressure.  I think patient's blood pressure is Nettey well.  I do not this is a sign of any cardiac disease per discussed with patient.   Greater than 50% of this 40  minute face to face visit was spent in counseling and discussion and coordination of care regarding the above diagnosis/diagnosies        Assessment & Plan:

## 2018-06-07 ENCOUNTER — Encounter: Payer: Self-pay | Admitting: Family Medicine

## 2018-06-07 NOTE — Telephone Encounter (Signed)
Letter printed and ready for pick up. LVM with pt making her aware letter is ready.

## 2018-06-09 ENCOUNTER — Ambulatory Visit (HOSPITAL_COMMUNITY)
Admission: RE | Admit: 2018-06-09 | Discharge: 2018-06-09 | Disposition: A | Discharge status: Home / Self Care | Payer: Managed Care, Other (non HMO) | Source: Ambulatory Visit | Attending: Family Medicine | Admitting: Family Medicine

## 2018-06-09 ENCOUNTER — Telehealth: Payer: Self-pay | Admitting: Family Medicine

## 2018-06-09 DIAGNOSIS — R7989 Other specified abnormal findings of blood chemistry: Secondary | ICD-10-CM | POA: Diagnosis present

## 2018-06-09 DIAGNOSIS — M79661 Pain in right lower leg: Secondary | ICD-10-CM | POA: Insufficient documentation

## 2018-06-09 NOTE — Telephone Encounter (Signed)
Pt wants to know if Dr. Richardson Landry would prescribe her Wellbutrin to help with smoking sensation.

## 2018-06-10 ENCOUNTER — Other Ambulatory Visit: Payer: Self-pay | Admitting: *Deleted

## 2018-06-10 ENCOUNTER — Encounter: Payer: Self-pay | Admitting: Family Medicine

## 2018-06-10 MED ORDER — BUPROPION HCL ER (SR) 150 MG PO TB12: 150.0000 mg | ORAL_TABLET | Freq: Two times a day (BID) | ORAL | 2 refills | Status: DC

## 2018-06-10 NOTE — Telephone Encounter (Signed)
Left a message with pt yesterday. See result note also

## 2018-06-10 NOTE — Telephone Encounter (Signed)
Discussed with pt and med sent to pharm.  

## 2018-06-10 NOTE — Telephone Encounter (Signed)
wellbutrin 150 sr one qam for three days then one bid 60 plus two refills

## 2018-06-11 ENCOUNTER — Ambulatory Visit (HOSPITAL_COMMUNITY): Payer: Managed Care, Other (non HMO)

## 2018-06-16 ENCOUNTER — Encounter: Payer: Self-pay | Admitting: Cardiovascular Disease

## 2018-06-28 ENCOUNTER — Telehealth: Payer: Self-pay | Admitting: Family Medicine

## 2018-06-28 ENCOUNTER — Encounter: Payer: Self-pay | Admitting: Family Medicine

## 2018-06-28 ENCOUNTER — Ambulatory Visit: Payer: Managed Care, Other (non HMO) | Admitting: Cardiovascular Disease

## 2018-06-28 NOTE — Telephone Encounter (Signed)
Please advise 

## 2018-06-28 NOTE — Telephone Encounter (Signed)
May have sickness note as requested

## 2018-06-28 NOTE — Telephone Encounter (Signed)
lvm for pt to call office to make aware work note is ready for pick up.

## 2018-06-28 NOTE — Telephone Encounter (Signed)
Pt is requesting work note for Friday night Saturday night and tonight due to whole family being sick. Emma Johnson was seen Friday with the flu and Terrence Dupont was sick at school Friday.

## 2018-07-05 ENCOUNTER — Encounter: Payer: Self-pay | Admitting: Family Medicine

## 2018-07-05 ENCOUNTER — Ambulatory Visit (INDEPENDENT_AMBULATORY_CARE_PROVIDER_SITE_OTHER): Payer: Managed Care, Other (non HMO) | Admitting: Family Medicine

## 2018-07-05 VITALS — BP 112/72 | Temp 97.9°F | Ht 69.0 in | Wt 196.2 lb

## 2018-07-05 DIAGNOSIS — J111 Influenza due to unidentified influenza virus with other respiratory manifestations: Secondary | ICD-10-CM | POA: Diagnosis not present

## 2018-07-05 MED ORDER — HYDROCODONE-HOMATROPINE 5-1.5 MG/5ML PO SYRP: ORAL_SOLUTION | ORAL | 0 refills | Status: DC

## 2018-07-05 NOTE — Progress Notes (Signed)
   Subjective:    Patient ID: Emma Johnson, female    DOB: 25-Apr-1976, 42 y.o.   MRN: [id number]  Cough  This is a new problem. The current episode started in the past 7 days (hit suddenly saturday). The cough is non-productive (dry). Associated symptoms include chills, ear pain, a fever and headaches. Pertinent negatives include no shortness of breath or wheezing. Associated symptoms comments: Severe body aches. Treatments tried: advil.   Saturday around 5:30 pm started with all over body aches and pains, chills and sweats, dry cough and bilateral ear pain. No sinus congestion. Thinks she had a fever Saturday, but has not checked her temperature. Reports waves of sweating/chills. Also having fatigue and headache to top of her head. Has tried taking ibuprofen.  Reports husband had flu-like symptoms last week.   Review of Systems  Constitutional: Positive for chills, diaphoresis, fatigue and fever.  HENT: Positive for ear pain. Negative for congestion.   Respiratory: Positive for cough. Negative for shortness of breath and wheezing.   Gastrointestinal: Negative for nausea and vomiting.  Neurological: Positive for headaches.       Objective:   Physical Exam  Constitutional: She is oriented to person, place, and time. She appears well-developed and well-nourished. No distress.  HENT:  Head: Normocephalic and atraumatic.  Right Ear: Tympanic membrane normal.  Left Ear: Tympanic membrane normal.  Nose: Nose normal.  Mouth/Throat: Uvula is midline and oropharynx is clear and moist.  Eyes: Right eye exhibits no discharge. Left eye exhibits no discharge.  Neck: Neck supple.  Cardiovascular: Normal rate, regular rhythm and normal heart sounds.  Pulmonary/Chest: Effort normal and breath sounds normal. No respiratory distress. She has no wheezes.  Lymphadenopathy:    She has no cervical adenopathy.  Neurological: She is alert and oriented to person, place, and time.  Skin: Skin is warm  and dry.  Psychiatric: She has a normal mood and affect.  Nursing note and vitals reviewed.     Assessment & Plan:  Influenza  Diagnosis based on pt reported symptoms. Almost 48 hours since onset, so don't feel tamiflu would be beneficial at this time. Recommend rest and hydration. Rx sent in for hycodan cough syrup to be taken at bedtime prn, PMP database checked. Warning signs discussed, f/u if symptoms worsen or fail to improve.  Dr. Mickie Hillier was consulted on this case and is in agreement with the above treatment plan.

## 2018-07-06 ENCOUNTER — Other Ambulatory Visit: Payer: Self-pay | Admitting: *Deleted

## 2018-07-06 MED ORDER — IBUPROFEN 800 MG PO TABS: 800.0000 mg | ORAL_TABLET | Freq: Three times a day (TID) | ORAL | 1 refills | Status: DC | PRN

## 2018-07-12 ENCOUNTER — Encounter: Payer: Self-pay | Admitting: Family Medicine

## 2018-07-12 ENCOUNTER — Ambulatory Visit (INDEPENDENT_AMBULATORY_CARE_PROVIDER_SITE_OTHER): Payer: Managed Care, Other (non HMO) | Admitting: Family Medicine

## 2018-07-12 VITALS — BP 122/68 | Temp 98.5°F | Ht 69.0 in | Wt 195.0 lb

## 2018-07-12 DIAGNOSIS — J4521 Mild intermittent asthma with (acute) exacerbation: Secondary | ICD-10-CM

## 2018-07-12 DIAGNOSIS — J019 Acute sinusitis, unspecified: Secondary | ICD-10-CM | POA: Diagnosis not present

## 2018-07-12 MED ORDER — ALBUTEROL SULFATE HFA 108 (90 BASE) MCG/ACT IN AERS: 2.0000 | INHALATION_SPRAY | Freq: Four times a day (QID) | RESPIRATORY_TRACT | 2 refills | Status: DC | PRN

## 2018-07-12 MED ORDER — HYDROCODONE-HOMATROPINE 5-1.5 MG/5ML PO SYRP: ORAL_SOLUTION | ORAL | 0 refills | Status: DC

## 2018-07-12 MED ORDER — DOXYCYCLINE HYCLATE 100 MG PO CAPS: 100.0000 mg | ORAL_CAPSULE | Freq: Two times a day (BID) | ORAL | 0 refills | Status: DC

## 2018-07-12 MED ORDER — PREDNISONE 20 MG PO TABS: ORAL_TABLET | ORAL | 0 refills | Status: DC

## 2018-07-12 NOTE — Patient Instructions (Signed)
How to Use a Metered Dose Inhaler A metered dose inhaler is a handheld device for taking medicine that must be breathed into the lungs (inhaled). The device can be used to deliver a variety of inhaled medicines, including:  Quick relief or rescue medicines, such as bronchodilators.  Controller medicines, such as corticosteroids.  The medicine is delivered by pushing down on a metal canister to release a preset amount of spray and medicine. Each device contains the amount of medicine that is needed for a preset number of uses (inhalations). Your health care provider may recommend that you use a spacer with your inhaler to help you take the medicine more effectively. A spacer is a plastic tube with a mouthpiece on one end and an opening that connects to the inhaler on the other end. A spacer holds the medicine in a tube for a short time, which allows you to inhale more medicine. What are the risks? If you do not use your inhaler correctly, medicine might not reach your lungs to help you breathe. Inhaler medicine can cause side effects, such as:  Mouth or throat infection.  Cough.  Hoarseness.  Headache.  Nausea and vomiting.  Lung infection (pneumonia) in people who have a lung condition called COPD.  How to use a metered dose inhaler without a spacer 1. Remove the cap from the inhaler. 2. If you are using the inhaler for the first time, shake it for 5 seconds, turn it away from your face, then release 4 puffs into the air. This is called priming. 3. Shake the inhaler for 5 seconds. 4. Position the inhaler so the top of the canister faces up. 5. Put your index finger on the top of the medicine canister. Support the bottom of the inhaler with your thumb. 6. Breathe out normally and as completely as possible, away from the inhaler. 7. Either place the inhaler between your teeth and close your lips tightly around the mouthpiece, or hold the inhaler 1-2 inches (2.5-5 cm) away from your open  mouth. Keep your tongue down out of the way. If you are unsure which technique to use, ask your health care provider. 8. Press the canister down with your index finger to release the medicine, then inhale deeply and slowly through your mouth (not your nose) until your lungs are completely filled. Inhaling should take 4-6 seconds. 9. Hold the medicine in your lungs for 5-10 seconds (10 seconds is best). This helps the medicine get into the small airways of your lungs. 10. With your lips in a tight circle (pursed), breathe out slowly. 11. Repeat steps 3-10 until you have taken the number of puffs that your health care provider directed. Wait about 1 minute between puffs or as directed. 12. Put the cap on the inhaler. 13. If you are using a steroid inhaler, rinse your mouth with water, gargle, and spit out the water. Do not swallow the water. How to use a metered dose inhaler with a spacer 1. Remove the cap from the inhaler. 2. If you are using the inhaler for the first time, shake it for 5 seconds, turn it away from your face, then release 4 puffs into the air. This is called priming. 3. Shake the inhaler for 5 seconds. 4. Place the open end of the spacer onto the inhaler mouthpiece. 5. Position the inhaler so the top of the canister faces up and the spacer mouthpiece faces you. 6. Put your index finger on the top of the medicine canister.   Support the bottom of the inhaler and the spacer with your thumb. 7. Breathe out normally and as completely as possible, away from the spacer. 8. Place the spacer between your teeth and close your lips tightly around it. Keep your tongue down out of the way. 9. Press the canister down with your index finger to release the medicine, then inhale deeply and slowly through your mouth (not your nose) until your lungs are completely filled. Inhaling should take 4-6 seconds. 10. Hold the medicine in your lungs for 5-10 seconds (10 seconds is best). This helps the medicine  get into the small airways of your lungs. 11. With your lips in a tight circle (pursed), breathe out slowly. 12. Repeat steps 3-11 until you have taken the number of puffs that your health care provider directed. Wait about 1 minute between puffs or as directed. 13. Remove the spacer from the inhaler and put the cap on the inhaler. 14. If you are using a steroid inhaler, rinse your mouth with water, gargle, and spit out the water. Do not swallow the water. Follow these instructions at home:  Take your inhaled medicine only as told by your health care provider. Do not use the inhaler more than directed by your health care provider.  Keep all follow-up visits as told by your health care provider. This is important.  If your inhaler has a counter, you can check it to determine how full your inhaler is. If your inhaler does not have a counter, ask your health care provider when you will need to refill your inhaler and write the refill date on a calendar or on your inhaler canister. Note that you cannot know when an inhaler is empty by shaking it.  Follow directions on the package insert for care and cleaning of your inhaler and spacer. Contact a health care provider if:  Symptoms are only partially relieved with your inhaler.  You are having trouble using your inhaler.  You have an increase in phlegm.  You have headaches. Get help right away if:  You feel little or no relief after using your inhaler.  You have dizziness.  You have a fast heart rate.  You have chills or a fever.  You have night sweats.  There is blood in your phlegm. Summary  A metered dose inhaler is a handheld device for taking medicine that must be breathed into the lungs (inhaled).  The medicine is delivered by pushing down on a metal canister to release a preset amount of spray and medicine.  Each device contains the amount of medicine that is needed for a preset number of uses (inhalations). This  information is not intended to replace advice given to you by your health care provider. Make sure you discuss any questions you have with your health care provider. Document Released: 07/28/2005 Document Revised: 06/17/2016 Document Reviewed: 06/17/2016 Elsevier Interactive Patient Education  2017 [employer] Inc[employer]  

## 2018-07-12 NOTE — Progress Notes (Signed)
   Subjective:    Patient ID: Emma Johnson, female    DOB: Jun 03, 1976, 42 y.o.   MRN: [id number]  Cough  This is a recurrent problem. Episode onset: seen last week and diagnosed with the flu. Associated symptoms include headaches, rhinorrhea, a sore throat, shortness of breath and wheezing. Pertinent negatives include no fever. Treatments tried: cough syrup, albuterol neb treatments.   Significant congestion coughing denies high fever chills relates feeling some intermittent chest tightness and some slight shortness of breath   Review of Systems  Constitutional: Negative for diaphoresis, fatigue and fever.  HENT: Positive for rhinorrhea and sore throat.   Respiratory: Positive for cough, shortness of breath and wheezing.   Gastrointestinal: Negative for nausea and vomiting.  Neurological: Positive for headaches.       Objective:   Physical Exam  Constitutional: She appears well-developed.  HENT:  Head: Normocephalic.  Nose: Nose normal.  Mouth/Throat: Oropharynx is clear and moist. No oropharyngeal exudate.  Neck: Neck supple.  Cardiovascular: Normal rate and normal heart sounds.  No murmur heard. Pulmonary/Chest: Effort normal. No respiratory distress. She has wheezes. She has no rales.  Lymphadenopathy:    She has no cervical adenopathy.  Skin: Skin is warm and dry.  Nursing note and vitals reviewed.         Assessment & Plan:  Post influenza infection with reactive airway Prednisone taper Antibiotics Hold off on x-ray lab work If not improving over the next few days notify us and recheck Warning signs were discussed in detail

## 2018-07-14 ENCOUNTER — Encounter: Payer: Self-pay | Admitting: Family Medicine

## 2018-07-14 ENCOUNTER — Telehealth: Payer: Self-pay | Admitting: Family Medicine

## 2018-07-14 NOTE — Telephone Encounter (Signed)
Please go ahead with a work excuse thank you

## 2018-07-14 NOTE — Telephone Encounter (Signed)
Patient needing work excuse for 11/29,11/30,12/1 and returning to work 12/6.

## 2018-07-14 NOTE — Telephone Encounter (Signed)
Was seen 07/12/2018 dx with the flu. May she have the requested excuses for work.

## 2018-07-14 NOTE — Telephone Encounter (Signed)
May have work excuse as requested follow-up if any problems

## 2018-07-19 ENCOUNTER — Encounter: Payer: Self-pay | Admitting: Cardiology

## 2018-07-19 ENCOUNTER — Ambulatory Visit (INDEPENDENT_AMBULATORY_CARE_PROVIDER_SITE_OTHER): Payer: Managed Care, Other (non HMO) | Admitting: Cardiology

## 2018-07-19 VITALS — BP 100/60 | HR 64 | Ht 69.5 in | Wt 199.6 lb

## 2018-07-19 DIAGNOSIS — R0789 Other chest pain: Secondary | ICD-10-CM

## 2018-07-19 DIAGNOSIS — R002 Palpitations: Secondary | ICD-10-CM | POA: Diagnosis not present

## 2018-07-19 NOTE — Patient Instructions (Signed)
Your physician recommends that you schedule a follow-up appointment in: AS NEEDED WITH DR BRANCH  Your physician recommends that you continue on your current medications as directed. Please refer to the Current Medication list given to you today.  Thank you for choosing [facility]!!    

## 2018-07-19 NOTE — Progress Notes (Signed)
Clinical Summary Emma Johnson is a 42 y.o.female seen as new consult, referred by Dr Wolfgang Phoenix for chest pain and palptitations.   1. Chest pain/palpitations - has been on hormone therapy - fullness in midchest, would release and led to tingling feeling. Could occur any time, lasting a few seconds. Few times a week. Fluttering feeling in chest, worst with deep breathing - last episode some time ago.  - no symptoms with activities  CAD risk factors: smoker x 23 years    2. Low bp's - bp's 70/50s in the past she reports by home monitor by manual cuff.  - asymptomatic - maintaing good hydration.    SH: 76 and 38 year olds at home.   Past Medical History:  Diagnosis Date  . AMA (advanced maternal age) multigravida 82+   . Depression   . Fibroids   . No pertinent past medical history      Allergies  Allergen Reactions  . Bee Venom   . Onion     Blisters in mouth and bloating     Current Outpatient Medications  Medication Sig Dispense Refill  . albuterol (PROVENTIL HFA;VENTOLIN HFA) 108 (90 Base) MCG/ACT inhaler Inhale 2 puffs into the lungs every 6 (six) hours as needed for wheezing. 1 Inhaler 2  . buPROPion (WELLBUTRIN SR) 150 MG 12 hr tablet Take 1 tablet (150 mg total) by mouth 2 (two) times daily. 60 tablet 2  . clonazePAM (KLONOPIN) 1 MG tablet Take 1/2 -1 pill po BID prn anxiety 30 tablet 3  . doxycycline (VIBRAMYCIN) 100 MG capsule Take 1 capsule (100 mg total) by mouth 2 (two) times daily. 20 capsule 0  . HYDROcodone-homatropine (HYCODAN) 5-1.5 MG/5ML syrup 5 ml qhs prn cough 60 mL 0  . ibuprofen (ADVIL,MOTRIN) 800 MG tablet Take 1 tablet (800 mg total) by mouth every 8 (eight) hours as needed. 30 tablet 1  . norethindrone (MICRONOR,CAMILA,ERRIN) 0.35 MG tablet Take 1 tablet (0.35 mg total) by mouth daily. 1 Package 11  . predniSONE (DELTASONE) 20 MG tablet 3qd for 3d then 2qd for 3d then 1qd for 3d 18 tablet 0  . traMADol (ULTRAM) 50 MG tablet Take 1 tablet (50 mg  total) by mouth every 6 (six) hours as needed for moderate pain or severe pain. (Patient not taking: Reported on 07/12/2018) 30 tablet 0   No current facility-administered medications for this visit.      Past Surgical History:  Procedure Laterality Date  . DILITATION & CURRETTAGE/HYSTROSCOPY WITH THERMACHOICE ABLATION N/A 05/18/2013   Procedure: DILATATION & CURETTAGE/HYSTEROSCOPY WITH THERMACHOICE ABLATION (Total Therapy Time=75minutes);  Surgeon: Florian Buff, MD;  Location: [facility];  Service: Gynecology;  Laterality: N/A;  . POLYPECTOMY N/A 05/18/2013   Procedure: ENDOMETRIAL POLYPECTOMY;  Surgeon: Florian Buff, MD;  Location: [facility];  Service: Gynecology;  Laterality: N/A;     Allergies  Allergen Reactions  . Bee Venom   . Onion     Blisters in mouth and bloating      Family History  Problem Relation Age of Onset  . Hypertension Father   . Multiple sclerosis Mother   . Cancer Maternal Aunt        uterine and lung  . Cancer Paternal Uncle        leukemia  . Cancer Maternal Grandmother        breast  . Heart disease Paternal Grandmother      Social History Ms. Soots reports that she has been smoking cigarettes.  She has a 10.00 pack-year smoking history. She has never used smokeless tobacco. Ms. Rennie reports that she does not drink alcohol.   Review of Systems CONSTITUTIONAL: No weight loss, fever, chills, weakness or fatigue.  HEENT: Eyes: No visual loss, blurred vision, double vision or yellow sclerae.No hearing loss, sneezing, congestion, runny nose or sore throat.  SKIN: No rash or itching.  CARDIOVASCULAR: per hpi RESPIRATORY: No shortness of breath, cough or sputum.  GASTROINTESTINAL: No anorexia, nausea, vomiting or diarrhea. No abdominal pain or blood.  GENITOURINARY: No burning on urination, no polyuria NEUROLOGICAL: No headache, dizziness, syncope, paralysis, ataxia, numbness or tingling in the extremities. No change in bowel or bladder control.    MUSCULOSKELETAL: No muscle, back pain, joint pain or stiffness.  LYMPHATICS: No enlarged nodes. No history of splenectomy.  PSYCHIATRIC: No history of depression or anxiety.  ENDOCRINOLOGIC: No reports of sweating, cold or heat intolerance. No polyuria or polydipsia.  Marland Kitchen   Physical Examination Vitals:   07/19/18 0845  BP: 100/60  Pulse: 64  SpO2: 98%   Vitals:   07/19/18 0845  Weight: 199 lb 9.6 oz (90.5 kg)  Height: 5' 9.5" (1.765 m)    Gen: resting comfortably, no acute distress HEENT: no scleral icterus, pupils equal round and reactive, no palptable cervical adenopathy,  CV: RRR, no mr/g, no jvd Resp: Clear to auscultation bilaterally GI: abdomen is soft, non-tender, non-distended, normal bowel sounds, no hepatosplenomegaly MSK: extremities are warm, no edema.  Skin: warm, no rash Neuro:  no focal deficits Psych: appropriate affect      Assessment and Plan  1. Chest pain/palptitations - young with minimal CAD risk factors, symptoms not constistent with ischemia - potentially some ectopy or arrhythmia after starting hormone therapy, symptoms have resolved. If reoccur would consider home event monitor. Currently asymptomatic  2. Low bp's - per patient report, from review of several clinic visits bp's have always been low normal. Unclear if technical issues with ambulatory bp's. If becomes consistent issue would consider check cortisol levels.    F/u as needed.       Arnoldo Lenis, M.D

## 2018-07-20 ENCOUNTER — Telehealth: Payer: Self-pay | Admitting: Family Medicine

## 2018-07-20 MED ORDER — HYDROCODONE-HOMATROPINE 5-1.5 MG/5ML PO SYRP: ORAL_SOLUTION | ORAL | 0 refills | Status: DC

## 2018-07-20 NOTE — Telephone Encounter (Signed)
Seen 12/2 and prescribed hycodan

## 2018-07-20 NOTE — Telephone Encounter (Signed)
Ok 3 oz one tspn qhs prn hycodan

## 2018-07-20 NOTE — Telephone Encounter (Signed)
Would like a refill on her cough meds.  Feels better but cough still keeping her up when she tried to sleep. [facility] in [place]

## 2018-07-20 NOTE — Telephone Encounter (Signed)
Patient is aware to pick up tomorrow from the front desk.

## 2018-08-23 ENCOUNTER — Ambulatory Visit: Payer: Managed Care, Other (non HMO) | Admitting: Family Medicine

## 2018-09-07 ENCOUNTER — Ambulatory Visit (INDEPENDENT_AMBULATORY_CARE_PROVIDER_SITE_OTHER): Payer: Managed Care, Other (non HMO) | Admitting: Family Medicine

## 2018-09-07 ENCOUNTER — Encounter: Payer: Self-pay | Admitting: Family Medicine

## 2018-09-07 VITALS — BP 112/80 | Ht 69.5 in | Wt 202.0 lb

## 2018-09-07 DIAGNOSIS — F411 Generalized anxiety disorder: Secondary | ICD-10-CM

## 2018-09-07 MED ORDER — BUPROPION HCL ER (XL) 150 MG PO TB24: 150.0000 mg | ORAL_TABLET | Freq: Every day | ORAL | 1 refills | Status: DC

## 2018-09-07 MED ORDER — CLONAZEPAM 1 MG PO TABS: ORAL_TABLET | ORAL | 3 refills | Status: DC

## 2018-09-07 NOTE — Progress Notes (Signed)
   Subjective:    Patient ID: Emma Johnson, female    DOB: 1976/07/07, 43 y.o.   MRN: [id number]  HPI Patient is here today to follow up on her chronic illnesses.  She is has a history of Generalized Anxiety disorder and is currently taking Wellbutrin 150 mg once per day,supposed to be on bid,   Pt still deeling with substantial anxiety    ocd at times csues some symtoms  Trying to re teach herself how to pick and choose her battles but made her feel " Not like me", and Klonopin 1 mg prn.No issues with the anxiety medication seems to be working.   wellbutrin sr made pt feel high   Review of Systems No headache, no major weight loss or weight gain, no chest pain no back pain abdominal pain no change in bowel habits complete ROS otherwise negative     Objective:   Physical Exam  Alert vitals stable, NAD. Blood pressure good on repeat. HEENT normal. Lungs clear. Heart regular rate and rhythm.       Assessment & Plan:  #1 impression generalized anxiety disorder.  Patient has decreased to just 1 150 mg Wellbutrin daily and states this is helping her.  Also uses Klonopin.  Exercising some but not a lot.  Will change to the XL formulation.  6 months worth of both prescribed follow-up in 6 months

## 2018-09-20 ENCOUNTER — Telehealth: Payer: Self-pay | Admitting: Family Medicine

## 2018-09-20 NOTE — Telephone Encounter (Signed)
I called and left a message to r/c. 

## 2018-09-20 NOTE — Telephone Encounter (Signed)
Sorry, correct answer

## 2018-09-20 NOTE — Telephone Encounter (Signed)
Patient experiencing sinus drainage, headache, head congestion. Offered patient to make an appt to come in office today and patient states she didn't need an appt just an antibiotic, advise.   Pharmacy:  [facility] [address], [place] [address]

## 2018-09-20 NOTE — Telephone Encounter (Signed)
Contacted patient. Informed patient that we do not send in ABT with out being seen. Pt states she has to work 12 hours today and tomorrow. Has been going on since about the middle of last week.  Pt states no fever, no shortness of breath; she states she knows its a sinus infection. Please advise. Thank you

## 2018-09-20 NOTE — Telephone Encounter (Signed)
Patient is aware of all and states she has no time to come down here for an appointment.I advised urgent care of ed if worsens today and asked if she would like to set up an appt for another day she says no.

## 2018-11-08 ENCOUNTER — Encounter: Payer: Self-pay | Admitting: Family Medicine

## 2018-11-18 ENCOUNTER — Other Ambulatory Visit: Payer: Self-pay

## 2018-11-18 ENCOUNTER — Encounter: Payer: Self-pay | Admitting: Family Medicine

## 2018-11-18 ENCOUNTER — Ambulatory Visit (INDEPENDENT_AMBULATORY_CARE_PROVIDER_SITE_OTHER): Payer: Managed Care, Other (non HMO) | Admitting: Family Medicine

## 2018-11-18 DIAGNOSIS — J019 Acute sinusitis, unspecified: Secondary | ICD-10-CM

## 2018-11-18 MED ORDER — HYDROCODONE-HOMATROPINE 5-1.5 MG/5ML PO SYRP: ORAL_SOLUTION | ORAL | 0 refills | Status: DC

## 2018-11-18 MED ORDER — AMOXICILLIN-POT CLAVULANATE 875-125 MG PO TABS: 1.0000 | ORAL_TABLET | Freq: Two times a day (BID) | ORAL | 0 refills | Status: DC

## 2018-11-18 NOTE — Progress Notes (Signed)
   Subjective:    Patient ID: Emma Johnson, female    DOB: 1975/10/30, 44 y.o.   MRN: [id number] Video visit accomplished today Per request the patient Patient was at home We were present in the office Coronavirus outbreak currently  Sinus Problem  This is a new problem. The current episode started in the past 7 days (started Monday). Associated symptoms include congestion and coughing.  Relates a lot of sinus pressure pain discomfort worse on the right side of her face radiates to the ear denies sweats chills or high fevers wheezing difficulty breathing denies any vomiting diarrhea or rash PMH benign Virtual Visit via Video Note  I connected with Renae Fickle on 11/18/18 at  9:30 AM EDT by a video enabled telemedicine application and verified that I am speaking with the correct person using two identifiers.   I discussed the limitations of evaluation and management by telemedicine and the availability of in person appointments. The patient expressed understanding and agreed to proceed.  History of Present Illness:    Observations/Objective:   Assessment and Plan:   Follow Up Instructions:    I discussed the assessment and treatment plan with the patient. The patient was provided an opportunity to ask questions and all were answered. The patient agreed with the plan and demonstrated an understanding of the instructions.   The patient was advised to call back or seek an in-person evaluation if the symptoms worsen or if the condition fails to improve as anticipated.  I provided 15 minutes of non-face-to-face time during this encounter.     Review of Systems  HENT: Positive for congestion.   Respiratory: Positive for cough.        Objective:   Physical Exam   Unable to do physical exam via phone but patient did not appear to be in respiratory distress but I talked with her     Assessment & Plan:  Acute rhinosinusitis Underlying allergies Medications  discussed Antibiotic sent in Work note given for the past 2 nights plus tonight Follow-up if progressive troubles

## 2018-11-26 ENCOUNTER — Other Ambulatory Visit: Payer: Self-pay

## 2018-11-26 ENCOUNTER — Ambulatory Visit (INDEPENDENT_AMBULATORY_CARE_PROVIDER_SITE_OTHER): Payer: Managed Care, Other (non HMO) | Admitting: Family Medicine

## 2018-11-26 DIAGNOSIS — J019 Acute sinusitis, unspecified: Secondary | ICD-10-CM | POA: Diagnosis not present

## 2018-11-26 DIAGNOSIS — M545 Low back pain, unspecified: Secondary | ICD-10-CM

## 2018-11-26 NOTE — Progress Notes (Signed)
   Subjective:    Patient ID: Emma Johnson, female    DOB: 04-28-1976, 43 y.o.   MRN: [id number]  Back Pain  This is a new problem. Episode onset: 3 days  The pain is present in the lumbar spine. The symptoms are aggravated by twisting and bending. She has tried heat and ice (tylenol, icy hot) for the symptoms.  pt helped move a trampoline and then back started aching.  Patient relates having stiffness in the back difficult to rotate to the right difficulty been difficult to get up off the toilet seat and properly clean her self in addition to this pain into the right leg has tried some ibuprofen tried some gentle stretches have a difficult time present for multiple days unable to do her job  Persistent sinus infection head congestion drainage coughing sinus pressure not feeling good denies wheezing difficulty breathing sweats Virtual Visit via Video Note  I connected with Renae Fickle on 11/26/18 at  2:30 PM EDT by a video enabled telemedicine application and verified that I am speaking with the correct person using two identifiers.   I discussed the limitations of evaluation and management by telemedicine and the availability of in person appointments. The patient expressed understanding and agreed to proceed.  History of Present Illness:    Observations/Objective:   Assessment and Plan:   Follow Up Instructions:    I discussed the assessment and treatment plan with the patient. The patient was provided an opportunity to ask questions and all were answered. The patient agreed with the plan and demonstrated an understanding of the instructions.   The patient was advised to call back or seek an in-person evaluation if the symptoms worsen or if the condition fails to improve as anticipated.  I provided 16 minutes of non-face-to-face time during this encounter.      Review of Systems  Musculoskeletal: Positive for back pain.  Does radiate into the right leg denies  numbness tingling weakness Does relate congestion coughing drainage hoarseness.  Denies high fever chills sweats no nausea vomiting diarrhea     Objective:   Physical Exam  Unable to do physical exam clear video patient not respiratory distress      Assessment & Plan:  Lumbar pain with some sciatica into the right leg no weakness Anti-inflammatory twice daily over the next 7 to 14 days Muscle relaxer 3 times daily as needed to help with stiffness Stretching exercises twice daily Because of her work I recommend no work through this Wednesday  Patient has persistent sinusitis 1 more week of antibiotics recommended.  This was sent in if having high fever chills or worse to notify us Cough medication can be used at nighttime to help with cough

## 2018-11-29 ENCOUNTER — Encounter: Payer: Self-pay | Admitting: Family Medicine

## 2018-11-29 MED ORDER — HYDROCODONE-HOMATROPINE 5-1.5 MG/5ML PO SYRP: ORAL_SOLUTION | ORAL | 0 refills | Status: DC

## 2018-11-29 MED ORDER — DICLOFENAC SODIUM 75 MG PO TBEC: 75.0000 mg | DELAYED_RELEASE_TABLET | Freq: Two times a day (BID) | ORAL | 0 refills | Status: DC

## 2018-11-29 MED ORDER — CHLORZOXAZONE 500 MG PO TABS: 500.0000 mg | ORAL_TABLET | Freq: Three times a day (TID) | ORAL | 0 refills | Status: DC | PRN

## 2018-11-29 MED ORDER — CEFDINIR 300 MG PO CAPS: 300.0000 mg | ORAL_CAPSULE | Freq: Two times a day (BID) | ORAL | 0 refills | Status: DC

## 2018-11-29 NOTE — Patient Instructions (Signed)
Please do your back exercises 2-3 times a day Antibiotic was sent into the pharmacy Anti-inflammatory was also sent in along with muscle relaxer Remember as for the cough medication it is best to use it more so only at nighttime and do not take it with a muscle relaxer    Back Exercises The following exercises strengthen the muscles that help to support the back. They also help to keep the lower back flexible. Doing these exercises can help to prevent back pain or lessen existing pain. If you have back pain or discomfort, try doing these exercises 2-3 times each day or as told by your health care provider. When the pain goes away, do them once each day, but increase the number of times that you repeat the steps for each exercise (do more repetitions). If you do not have back pain or discomfort, do these exercises once each day or as told by your health care provider. Exercises Single Knee to Chest Repeat these steps 3-5 times for each leg: 1. Lie on your back on a firm bed or the floor with your legs extended. 2. Bring one knee to your chest. Your other leg should stay extended and in contact with the floor. 3. Hold your knee in place by grabbing your knee or thigh. 4. Pull on your knee until you feel a gentle stretch in your lower back. 5. Hold the stretch for 10-30 seconds. 6. Slowly release and straighten your leg. Pelvic Tilt Repeat these steps 5-10 times: 1. Lie on your back on a firm bed or the floor with your legs extended. 2. Bend your knees so they are pointing toward the ceiling and your feet are flat on the floor. 3. Tighten your lower abdominal muscles to press your lower back against the floor. This motion will tilt your pelvis so your tailbone points up toward the ceiling instead of pointing to your feet or the floor. 4. With gentle tension and even breathing, hold this position for 5-10 seconds. Cat-Cow Repeat these steps until your lower back becomes more flexible: 1. Get  into a hands-and-knees position on a firm surface. Keep your hands under your shoulders, and keep your knees under your hips. You may place padding under your knees for comfort. 2. Let your head hang down, and point your tailbone toward the floor so your lower back becomes rounded like the back of a cat. 3. Hold this position for 5 seconds. 4. Slowly lift your head and point your tailbone up toward the ceiling so your back forms a sagging arch like the back of a cow. 5. Hold this position for 5 seconds.  Press-Ups Repeat these steps 5-10 times: 1. Lie on your abdomen (face-down) on the floor. 2. Place your palms near your head, about shoulder-width apart. 3. While you keep your back as relaxed as possible and keep your hips on the floor, slowly straighten your arms to raise the top half of your body and lift your shoulders. Do not use your back muscles to raise your upper torso. You may adjust the placement of your hands to make yourself more comfortable. 4. Hold this position for 5 seconds while you keep your back relaxed. 5. Slowly return to lying flat on the floor.  Bridges Repeat these steps 10 times: 1. Lie on your back on a firm surface. 2. Bend your knees so they are pointing toward the ceiling and your feet are flat on the floor. 3. Tighten your buttocks muscles and lift your  buttocks off of the floor until your waist is at almost the same height as your knees. You should feel the muscles working in your buttocks and the back of your thighs. If you do not feel these muscles, slide your feet 1-2 inches farther away from your buttocks. 4. Hold this position for 3-5 seconds. 5. Slowly lower your hips to the starting position, and allow your buttocks muscles to relax completely. If this exercise is too easy, try doing it with your arms crossed over your chest. Abdominal Crunches Repeat these steps 5-10 times: 1. Lie on your back on a firm bed or the floor with your legs extended. 2. Bend  your knees so they are pointing toward the ceiling and your feet are flat on the floor. 3. Cross your arms over your chest. 4. Tip your chin slightly toward your chest without bending your neck. 5. Tighten your abdominal muscles and slowly raise your trunk (torso) high enough to lift your shoulder blades a tiny bit off of the floor. Avoid raising your torso higher than that, because it can put too much stress on your low back and it does not help to strengthen your abdominal muscles. 6. Slowly return to your starting position. Back Lifts Repeat these steps 5-10 times: 1. Lie on your abdomen (face-down) with your arms at your sides, and rest your forehead on the floor. 2. Tighten the muscles in your legs and your buttocks. 3. Slowly lift your chest off of the floor while you keep your hips pressed to the floor. Keep the back of your head in line with the curve in your back. Your eyes should be looking at the floor. 4. Hold this position for 3-5 seconds. 5. Slowly return to your starting position. Contact a health care provider if:  Your back pain or discomfort gets much worse when you do an exercise.  Your back pain or discomfort does not lessen within 2 hours after you exercise. If you have any of these problems, stop doing these exercises right away. Do not do them again unless your health care provider says that you can. Get help right away if:  You develop sudden, severe back pain. If this happens, stop doing the exercises right away. Do not do them again unless your health care provider says that you can. This information is not intended to replace advice given to you by your health care provider. Make sure you discuss any questions you have with your health care provider. Document Released: 09/04/2004 Document Revised: 12/01/2017 Document Reviewed: 09/21/2014 Elsevier Interactive Patient Education  [employer].

## 2018-12-31 ENCOUNTER — Other Ambulatory Visit: Payer: Self-pay

## 2018-12-31 ENCOUNTER — Ambulatory Visit (INDEPENDENT_AMBULATORY_CARE_PROVIDER_SITE_OTHER): Payer: Managed Care, Other (non HMO) | Admitting: Family Medicine

## 2018-12-31 DIAGNOSIS — J329 Chronic sinusitis, unspecified: Secondary | ICD-10-CM | POA: Diagnosis not present

## 2018-12-31 MED ORDER — HYDROCODONE-HOMATROPINE 5-1.5 MG/5ML PO SYRP: ORAL_SOLUTION | ORAL | 0 refills | Status: DC

## 2018-12-31 MED ORDER — DOXYCYCLINE HYCLATE 100 MG PO TABS: ORAL_TABLET | ORAL | 0 refills | Status: DC

## 2018-12-31 NOTE — Progress Notes (Signed)
   Subjective:  Audio plus visual  Patient ID: Emma Johnson, female    DOB: 1976/02/10, 43 y.o.   MRN: [id number]  Sinus Problem  This is a new problem. The current episode started in the past 7 days. Associated symptoms include congestion, coughing, ear pain, headaches and sinus pressure. Treatments tried: otc meds(homeopatic)   Facial pain sharp at times aching at other times.  Worse with change in position.  Persistent.  Had similar last month.  Unfortunately smokes.  Right ear muffled.  No chest symptoms   Review of Systems  HENT: Positive for congestion, ear pain and sinus pressure.   Respiratory: Positive for cough.   Neurological: Positive for headaches.       Objective:   Physical Exam  Virtual visit      Assessment & Plan:  Impression rhinosinusitis likely post viral, discussed with patient. plan antibiotics prescribed. Questions answered. Symptomatic care discussed. warning signs discussed. WSL Doxycycline twice a day for 14 full days.  Discouraged TM tube at this time patient was wondering.  Also bad nighttime cough will prescribe Hycodan

## 2019-01-11 ENCOUNTER — Telehealth: Payer: Self-pay | Admitting: Family Medicine

## 2019-01-11 NOTE — Telephone Encounter (Signed)
Pt's grandmother passed away last week. 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

## 2019-01-11 NOTE — Telephone Encounter (Signed)
Ok wis 

## 2019-01-11 NOTE — Telephone Encounter (Signed)
Ok to give patient work note as requested

## 2019-01-12 ENCOUNTER — Encounter: Payer: Self-pay | Admitting: Family Medicine

## 2019-01-12 NOTE — Telephone Encounter (Signed)
Tried to call pt and let know work note is completed vm box is full unable to leave message

## 2019-03-01 ENCOUNTER — Ambulatory Visit: Payer: Managed Care, Other (non HMO) | Admitting: Family Medicine

## 2019-06-07 ENCOUNTER — Telehealth: Payer: Self-pay | Admitting: *Deleted

## 2019-06-07 ENCOUNTER — Other Ambulatory Visit: Payer: Self-pay | Admitting: Obstetrics and Gynecology

## 2019-06-07 ENCOUNTER — Other Ambulatory Visit: Payer: Self-pay | Admitting: *Deleted

## 2019-06-07 NOTE — Telephone Encounter (Signed)
Refill request sent to Dr. Glo Herring for norethindrone.

## 2019-06-08 MED ORDER — NORETHINDRONE 0.35 MG PO TABS: 1.0000 | ORAL_TABLET | Freq: Every day | ORAL | 11 refills | Status: DC

## 2019-07-05 ENCOUNTER — Telehealth: Payer: Self-pay | Admitting: Family Medicine

## 2019-07-05 MED ORDER — PREDNISONE 20 MG PO TABS: ORAL_TABLET | ORAL | 0 refills | Status: DC

## 2019-07-05 NOTE — Telephone Encounter (Signed)
Adult pred taper 

## 2019-07-05 NOTE — Telephone Encounter (Signed)
Pt has poison oak on left arm. Pt had 2 or 3 spots pop up on face but is unsure if it is from mask or not. Pt cut trees on Saturday and noticed the bumps on arm last night. Please advise. Thank you

## 2019-07-05 NOTE — Telephone Encounter (Signed)
Medication sent in and pt is aware  

## 2019-07-05 NOTE — Telephone Encounter (Signed)
Patient is requesting a prescription for prednisone called in the poison oak on left arm/elbow to [facility]/[address]

## 2019-09-12 ENCOUNTER — Encounter: Payer: Self-pay | Admitting: Family Medicine

## 2019-10-31 ENCOUNTER — Other Ambulatory Visit: Payer: Self-pay | Admitting: Otolaryngology

## 2019-10-31 ENCOUNTER — Other Ambulatory Visit (HOSPITAL_COMMUNITY): Payer: Self-pay | Admitting: Otolaryngology

## 2019-10-31 DIAGNOSIS — J320 Chronic maxillary sinusitis: Secondary | ICD-10-CM

## 2019-11-10 ENCOUNTER — Ambulatory Visit (HOSPITAL_COMMUNITY): Payer: Managed Care, Other (non HMO)

## 2019-11-10 ENCOUNTER — Encounter (HOSPITAL_COMMUNITY): Payer: Self-pay

## 2019-11-17 ENCOUNTER — Encounter: Payer: Self-pay | Admitting: Family Medicine

## 2019-11-17 ENCOUNTER — Telehealth: Payer: Self-pay | Admitting: Family Medicine

## 2019-11-17 NOTE — Telephone Encounter (Signed)
Work note is up front for pickup

## 2019-11-17 NOTE — Telephone Encounter (Signed)
Patient is requesting work note from 4/7-4/8 returning 4/9 due to hemorrhoid flare-up. Please advise

## 2019-11-17 NOTE — Telephone Encounter (Signed)
ok 

## 2019-12-12 ENCOUNTER — Ambulatory Visit (INDEPENDENT_AMBULATORY_CARE_PROVIDER_SITE_OTHER): Payer: Managed Care, Other (non HMO) | Admitting: Family Medicine

## 2019-12-12 ENCOUNTER — Other Ambulatory Visit: Payer: Self-pay

## 2019-12-12 ENCOUNTER — Encounter: Payer: Self-pay | Admitting: Family Medicine

## 2019-12-12 VITALS — BP 120/72 | Temp 97.5°F | Ht 69.0 in | Wt 202.8 lb

## 2019-12-12 DIAGNOSIS — K2900 Acute gastritis without bleeding: Secondary | ICD-10-CM

## 2019-12-12 DIAGNOSIS — R1010 Upper abdominal pain, unspecified: Secondary | ICD-10-CM | POA: Diagnosis not present

## 2019-12-12 MED ORDER — SUCRALFATE 1 G PO TABS: ORAL_TABLET | ORAL | 0 refills | Status: DC

## 2019-12-12 MED ORDER — PANTOPRAZOLE SODIUM 40 MG PO TBEC: 40.0000 mg | DELAYED_RELEASE_TABLET | Freq: Every day | ORAL | 2 refills | Status: DC

## 2019-12-12 NOTE — Progress Notes (Signed)
   Subjective:    Patient ID: Emma Johnson, female    DOB: Jun 10, 1976, 44 y.o.   MRN: [id number]  HPI  Patient arrives for abdominal pain and nausea off and on for 3-4 weeks. Patient also having bloating and excess belching and gas.  Pt having discomfort in the ruq and around to the back feeling like a pulled muscle  Wondering about her gallbladder   Feels indegestion evty day  Certain foods flare it up  Drinks some caffeine two 16 oz per day   Pt smoking   At times pin is farly severe  Uncomfortable when bending over  Bowels are soft once per several days  Exercise  Stays very active,       Review of Systems No headache, no major weight loss or weight gain, no chest pain no back pain abdominal pain no change in bowel habits complete ROS otherwise negative     Objective:   Physical Exam  Alert and oriented, vitals reviewed and stable, NAD ENT-TM's and ext canals WNL bilat via otoscopic exam Soft palate, tonsils and post pharynx WNL via oropharyngeal exam Neck-symmetric, no masses; thyroid nonpalpable and nontender Pulmonary-no tachypnea or accessory muscle use; Clear without wheezes via auscultation Card--no abnrml murmurs, rhythm reg and rate WNL Carotid pulses symmetric, without bruits Epigastrium distinctly tender.  No rebound no guarding excellent bowel sounds no true right upper quadrant tenderness      Assessment & Plan:  Impression probable reflux with elements of gastritis.  Doubt gallbladder rationale discussed.  Patient encouraged to cut down her smoking out ideally.  Also cut down caffeine intake.  Initiate Protonix.  2 weeks worth of Carafate to jumpstart things.  Follow-up with Dr. Lovena Le in 2 months.  Warning signs discussed.  Greater than 50% of this 30 minute face to face visit was spent in counseling and discussion and coordination of care regarding the above diagnosis/diagnosies

## 2019-12-12 NOTE — Patient Instructions (Signed)
reflux Gastroesophageal Reflux Disease, Adult Gastroesophageal reflux (GER) happens when acid from the stomach flows up into the tube that connects the mouth and the stomach (esophagus). Normally, food travels down the esophagus and stays in the stomach to be digested. However, when a person has GER, food and stomach acid sometimes move back up into the esophagus. If this becomes a more serious problem, the person may be diagnosed with a disease called gastroesophageal reflux disease (GERD). GERD occurs when the reflux:  Happens often.  Causes frequent or severe symptoms.  Causes problems such as damage to the esophagus. When stomach acid comes in contact with the esophagus, the acid may cause soreness (inflammation) in the esophagus. Over time, GERD may create small holes (ulcers) in the lining of the esophagus. What are the causes? This condition is caused by a problem with the muscle between the esophagus and the stomach (lower esophageal sphincter, or LES). Normally, the LES muscle closes after food passes through the esophagus to the stomach. When the LES is weakened or abnormal, it does not close properly, and that allows food and stomach acid to go back up into the esophagus. The LES can be weakened by certain dietary substances, medicines, and medical conditions, including:  Tobacco use.  Pregnancy.  Having a hiatal hernia.  Alcohol use.  Certain foods and beverages, such as coffee, chocolate, onions, and peppermint. What increases the risk? You are more likely to develop this condition if you:  Have an increased body weight.  Have a connective tissue disorder.  Use NSAID medicines. What are the signs or symptoms? Symptoms of this condition include:  Heartburn.  Difficult or painful swallowing.  The feeling of having a lump in the throat.  Abitter taste in the mouth.  Bad breath.  Having a large amount of saliva.  Having an upset or bloated  stomach.  Belching.  Chest pain. Different conditions can cause chest pain. Make sure you see your health care provider if you experience chest pain.  Shortness of breath or wheezing.  Ongoing (chronic) cough or a night-time cough.  Wearing away of tooth enamel.  Weight loss. How is this diagnosed? Your health care provider will take a medical history and perform a physical exam. To determine if you have mild or severe GERD, your health care provider may also monitor how you respond to treatment. You may also have tests, including:  A test to examine your stomach and esophagus with a small camera (endoscopy).  A test thatmeasures the acidity level in your esophagus.  A test thatmeasures how much pressure is on your esophagus.  A barium swallow or modified barium swallow test to show the shape, size, and functioning of your esophagus. How is this treated? The goal of treatment is to help relieve your symptoms and to prevent complications. Treatment for this condition may vary depending on how severe your symptoms are. Your health care provider may recommend:  Changes to your diet.  Medicine.  Surgery. Follow these instructions at home: Eating and drinking   Follow a diet as recommended by your health care provider. This may involve avoiding foods and drinks such as: ? Coffee and tea (with or without caffeine). ? Drinks that containalcohol. ? Energy drinks and sports drinks. ? Carbonated drinks or sodas. ? Chocolate and cocoa. ? Peppermint and mint flavorings. ? Garlic and onions. ? Horseradish. ? Spicy and acidic foods, including peppers, chili powder, curry powder, vinegar, hot sauces, and barbecue sauce. ? Citrus fruit juices and  citrus fruits, such as oranges, lemons, and limes. ? Tomato-based foods, such as red sauce, chili, salsa, and pizza with red sauce. ? Fried and fatty foods, such as donuts, french fries, potato chips, and high-fat dressings. ? High-fat  meats, such as hot dogs and fatty cuts of red and white meats, such as rib eye steak, sausage, ham, and bacon. ? High-fat dairy items, such as whole milk, butter, and cream cheese.  Eat small, frequent meals instead of large meals.  Avoid drinking large amounts of liquid with your meals.  Avoid eating meals during the 2-3 hours before bedtime.  Avoid lying down right after you eat.  Do not exercise right after you eat. Lifestyle   Do not use any products that contain nicotine or tobacco, such as cigarettes, e-cigarettes, and chewing tobacco. If you need help quitting, ask your health care provider.  Try to reduce your stress by using methods such as yoga or meditation. If you need help reducing stress, ask your health care provider.  If you are overweight, reduce your weight to an amount that is healthy for you. Ask your health care provider for guidance about a safe weight loss goal. General instructions  Pay attention to any changes in your symptoms.  Take over-the-counter and prescription medicines only as told by your health care provider. Do not take aspirin, ibuprofen, or other NSAIDs unless your health care provider told you to do so.  Wear loose-fitting clothing. Do not wear anything tight around your waist that causes pressure on your abdomen.  Raise (elevate) the head of your bed about 6 inches (15 cm).  Avoid bending over if this makes your symptoms worse.  Keep all follow-up visits as told by your health care provider. This is important. Contact a health care provider if:  You have: ? New symptoms. ? Unexplained weight loss. ? Difficulty swallowing or it hurts to swallow. ? Wheezing or a persistent cough. ? A hoarse voice.  Your symptoms do not improve with treatment. Get help right away if you:  Have pain in your arms, neck, jaw, teeth, or back.  Feel sweaty, dizzy, or light-headed.  Have chest pain or shortness of breath.  Vomit and your vomit looks  like blood or coffee grounds.  Faint.  Have stool that is bloody or black.  Cannot swallow, drink, or eat. Summary  Gastroesophageal reflux happens when acid from the stomach flows up into the esophagus. GERD is a disease in which the reflux happens often, causes frequent or severe symptoms, or causes problems such as damage to the esophagus.  Treatment for this condition may vary depending on how severe your symptoms are. Your health care provider may recommend diet and lifestyle changes, medicine, or surgery.  Contact a health care provider if you have new or worsening symptoms.  Take over-the-counter and prescription medicines only as told by your health care provider. Do not take aspirin, ibuprofen, or other NSAIDs unless your health care provider told you to do so.  Keep all follow-up visits as told by your health care provider. This is important. This information is not intended to replace advice given to you by your health care provider. Make sure you discuss any questions you have with your health care provider. Document Revised: 02/03/2018 Document Reviewed: 02/03/2018 Elsevier Patient Education  [employer].

## 2020-02-20 ENCOUNTER — Ambulatory Visit: Payer: Managed Care, Other (non HMO) | Admitting: Family Medicine

## 2020-05-11 ENCOUNTER — Other Ambulatory Visit: Payer: Self-pay | Admitting: *Deleted

## 2020-05-11 MED ORDER — PANTOPRAZOLE SODIUM 40 MG PO TBEC: 40.0000 mg | DELAYED_RELEASE_TABLET | Freq: Every day | ORAL | 0 refills | Status: DC

## 2020-06-20 ENCOUNTER — Other Ambulatory Visit: Payer: Self-pay | Admitting: *Deleted

## 2020-06-21 MED ORDER — NORETHINDRONE 0.35 MG PO TABS: 1.0000 | ORAL_TABLET | Freq: Every day | ORAL | 12 refills | Status: DC

## 2020-09-18 ENCOUNTER — Other Ambulatory Visit: Payer: Self-pay

## 2020-09-18 ENCOUNTER — Encounter: Payer: Self-pay | Admitting: Family Medicine

## 2020-09-18 ENCOUNTER — Ambulatory Visit (INDEPENDENT_AMBULATORY_CARE_PROVIDER_SITE_OTHER): Payer: Managed Care, Other (non HMO) | Admitting: Family Medicine

## 2020-09-18 VITALS — BP 118/68 | HR 67 | Temp 97.6°F | Ht 69.0 in | Wt 202.6 lb

## 2020-09-18 DIAGNOSIS — K219 Gastro-esophageal reflux disease without esophagitis: Secondary | ICD-10-CM

## 2020-09-18 DIAGNOSIS — F411 Generalized anxiety disorder: Secondary | ICD-10-CM | POA: Diagnosis not present

## 2020-09-18 DIAGNOSIS — M5416 Radiculopathy, lumbar region: Secondary | ICD-10-CM

## 2020-09-18 MED ORDER — IBUPROFEN 800 MG PO TABS: 800.0000 mg | ORAL_TABLET | Freq: Three times a day (TID) | ORAL | 0 refills | Status: DC | PRN

## 2020-09-18 MED ORDER — PANTOPRAZOLE SODIUM 40 MG PO TBEC: 40.0000 mg | DELAYED_RELEASE_TABLET | Freq: Every day | ORAL | 2 refills | Status: DC

## 2020-09-18 MED ORDER — PREDNISONE 20 MG PO TABS: ORAL_TABLET | ORAL | 0 refills | Status: DC

## 2020-09-18 NOTE — Progress Notes (Signed)
Patient ID: Emma Johnson, female    DOB: 18-Oct-1975, 45 y.o.   MRN: [id number]   Chief Complaint  Patient presents with  . Leg Pain   Subjective:    HPI  Pt having right leg pain. Radiates from back to thigh. Front of leg is sometimes numb. Pt having some tenderness. Some swelling. Pt has been doing a lot of Epsom Salt baths, ice, heat, Advil Dual Action. Noticed pain around the middle of January (about 2-3 weeks). Pt states she feels that if her hip joint would pop that would relieve the pressure.   No new injury.   Pt stating pain starts in lower back and radiating down the leg. No imaging of back recently. In 2019- did u/s and was normal.  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 Pain- dull achy throbbing mostly. Then can have a sharp pain occ.  Intermittent pain. meds- (above.)  gerd- Pt was on protonix for 2 months, then pt quit taking it. Pt requesting a refill.  Not having periods. Pt taking micronor from gyn, Dr. Sarajane Johnson. Had ablation and d&c after her last pregnancy. Then not having regular periods, but has symptoms of it. 2018-2019- last period.  Medical History Emma Johnson has a past medical history of AMA (advanced maternal age) multigravida 35+, Depression, Fibroids, and No pertinent past medical history.   Outpatient Encounter Medications as of 09/18/2020  Medication Sig  . clonazePAM (KLONOPIN) 1 MG tablet Take by mouth.  Marland Kitchen ibuprofen (ADVIL) 800 MG tablet Take 1 tablet (800 mg total) by mouth every 8 (eight) hours as needed.  . norethindrone (MICRONOR) 0.35 MG tablet Take 1 tablet (0.35 mg total) by mouth daily.  . predniSONE (DELTASONE) 20 MG tablet Take 3 tab p.o. x 2 days, then 2 tab x 2 days, then 1 tab x 2 days.  Take with food.  . pantoprazole (PROTONIX) 40 MG tablet Take 1 tablet (40 mg total) by mouth daily.  . [DISCONTINUED] albuterol (PROVENTIL HFA;VENTOLIN HFA) 108 (90 Base) MCG/ACT inhaler Inhale 2 puffs into the lungs every 6 (six) hours as  needed for wheezing.  . [DISCONTINUED] buPROPion (WELLBUTRIN XL) 150 MG 24 hr tablet Take 1 tablet (150 mg total) by mouth daily.  . [DISCONTINUED] chlorzoxazone (PARAFON FORTE DSC) 500 MG tablet Take 1 tablet (500 mg total) by mouth 3 (three) times daily as needed for muscle spasms.  . [DISCONTINUED] clonazePAM (KLONOPIN) 1 MG tablet Take 1/2 -1 pill po BID prn anxiety  . [DISCONTINUED] diclofenac (VOLTAREN) 75 MG EC tablet Take 1 tablet (75 mg total) by mouth 2 (two) times daily.  . [DISCONTINUED] ibuprofen (ADVIL,MOTRIN) 800 MG tablet Take 1 tablet (800 mg total) by mouth every 8 (eight) hours as needed.  . [DISCONTINUED] pantoprazole (PROTONIX) 40 MG tablet Take 1 tablet (40 mg total) by mouth daily.  . [DISCONTINUED] sucralfate (CARAFATE) 1 g tablet ONE TAB IN 1 OZ OF WATER AC   No facility-administered encounter medications on file as of 09/18/2020.     Review of Systems  Constitutional: Negative for chills and fever.  HENT: Negative for congestion, rhinorrhea and sore throat.   Respiratory: Negative for cough, shortness of breath and wheezing.   Cardiovascular: Negative for chest pain and leg swelling.  Gastrointestinal: Negative for abdominal pain, diarrhea, nausea and vomiting.  Genitourinary: Negative for dysuria and frequency.  Musculoskeletal: Positive for arthralgias (rt leg pain) and back pain.  Skin: Negative for rash.  Neurological: Negative for dizziness, weakness and headaches.  Vitals BP 118/68   Pulse 67   Temp 97.6 F (36.4 C)   Ht 5\' 9"  (1.753 m)   Wt 202 lb 9.6 oz (91.9 kg)   SpO2 100%   BMI 29.92 kg/m   Objective:   Physical Exam Vitals and nursing note reviewed.  Constitutional:      General: She is not in acute distress.    Appearance: Normal appearance. She is normal weight. She is not ill-appearing.  Pulmonary:     Effort: No respiratory distress.  Musculoskeletal:        General: No swelling, tenderness, deformity or signs of injury. Normal  range of motion.     Cervical back: Normal range of motion.     Right lower leg: No edema.     Left lower leg: No edema.     Comments: +ttp over rt lumbar area, rt buttock area. +SLR on right. Normal inspection to skin. Normal rom of back.  Skin:    General: Skin is warm and dry.     Findings: No rash.  Neurological:     General: No focal deficit present.     Mental Status: She is alert and oriented to person, place, and time.     Cranial Nerves: No cranial nerve deficit.     Sensory: No sensory deficit.     Motor: No weakness.     Gait: Gait normal.     Deep Tendon Reflexes: Reflexes normal.  Psychiatric:        Mood and Affect: Mood normal.        Behavior: Behavior normal.      Assessment and Plan   1. Lumbar radiculopathy - predniSONE (DELTASONE) 20 MG tablet; Take 3 tab p.o. x 2 days, then 2 tab x 2 days, then 1 tab x 2 days.  Take with food.  Dispense: 12 tablet; Refill: 0 - ibuprofen (ADVIL) 800 MG tablet; Take 1 tablet (800 mg total) by mouth every 8 (eight) hours as needed.  Dispense: 45 tablet; Refill: 0  2. Gastroesophageal reflux disease, unspecified whether esophagitis present - pantoprazole (PROTONIX) 40 MG tablet; Take 1 tablet (40 mg total) by mouth daily.  Dispense: 30 tablet; Refill: 2  3. Generalized anxiety disorder - clonazePAM (KLONOPIN) 1 MG tablet; Take by mouth.   Lumbar radiculopathy- right- Ordered prednisone, ibuprofen and heat/ice, rest. If not improving or worsening to call. Advising to eat food with meds. Xray in 2-3 wks if not improved.  gerd- stable. Pt requesting refill protonix.  Pt has h/o anxiety- taking Klonopin from previous provider, last script was in 1/20. Not currently taking this.  F/u prn.

## 2020-09-19 ENCOUNTER — Encounter: Payer: Self-pay | Admitting: Family Medicine

## 2020-09-19 ENCOUNTER — Telehealth: Payer: Self-pay | Admitting: Family Medicine

## 2020-09-19 NOTE — Telephone Encounter (Signed)
Please advise. Thank you

## 2020-09-19 NOTE — Telephone Encounter (Signed)
Please give patient work note. Thank you 

## 2020-09-19 NOTE — Telephone Encounter (Signed)
Patient was seen 09/18/2020 and needing a work note for hip pain from 09/18/2020-09/20/2020 returning on 2/10 she works 3rd shift.

## 2020-09-19 NOTE — Telephone Encounter (Signed)
Yes, pls give note. Thx  Dr. Denna Fryberger 

## 2020-10-01 ENCOUNTER — Encounter: Payer: Self-pay | Admitting: Emergency Medicine

## 2020-10-01 ENCOUNTER — Ambulatory Visit
Admission: EM | Admit: 2020-10-01 | Discharge: 2020-10-01 | Disposition: A | Discharge status: Home / Self Care | Payer: Managed Care, Other (non HMO) | Attending: Emergency Medicine | Admitting: Emergency Medicine

## 2020-10-01 DIAGNOSIS — M5431 Sciatica, right side: Secondary | ICD-10-CM | POA: Diagnosis not present

## 2020-10-01 DIAGNOSIS — M5416 Radiculopathy, lumbar region: Secondary | ICD-10-CM

## 2020-10-01 MED ORDER — DEXAMETHASONE SODIUM PHOSPHATE 10 MG/ML IJ SOLN
10.0000 mg | Freq: Once | INTRAMUSCULAR | Status: AC
  Administered 2020-10-01: 10 mg via INTRAMUSCULAR

## 2020-10-01 MED ORDER — PREDNISONE 20 MG PO TABS: ORAL_TABLET | ORAL | 0 refills | Status: DC

## 2020-10-01 NOTE — ED Triage Notes (Signed)
Was seen by doctor on feb 8th and was given prednisone for hip and right leg pain.  Woke up today with right hip pain that radiated down right leg.

## 2020-10-01 NOTE — Discharge Instructions (Signed)
Rest, ice and heat as needed Ensure adequate ROM as tolerated. Decadron IM was given in office Prescribed prednisone Continue to take OTC Tylenol as needed for pain Return here or go to ER if you have any new or worsening symptoms such as numbness/tingling of the inner thighs, loss of bladder or bowel control, headache/blurry vision, nausea/vomiting, confusion/altered mental status, dizziness, weakness, passing out, imbalance, etc..Marland Kitchen

## 2020-10-01 NOTE — ED Provider Notes (Signed)
[place]   [id number] 10/01/20 Arrival Time: [place]   Chief Complaint  Patient presents with  . Back Pain     SUBJECTIVE: History from: patient.  Emma Johnson is a 45 y.o. female who presented to the urgent care with a complaint of right hip pain that radiated to the right leg for the first few days.  Denies any precipitating event.  Localized pain to the right hip and the right leg.  She describes the pain as constant and achy.  She has tried OTC medications without relief.  Her symptoms are made worse with ROM.  She  denies similar symptoms in the past.  Denies chills, fever, nausea, vomiting, paresthesia.   ROS: As per HPI.  All other pertinent ROS negative.     Past Medical History:  Diagnosis Date  . AMA (advanced maternal age) multigravida 10+   . Depression   . Fibroids   . No pertinent past medical history    Past Surgical History:  Procedure Laterality Date  . DILITATION & CURRETTAGE/HYSTROSCOPY WITH THERMACHOICE ABLATION N/A 05/18/2013   Procedure: DILATATION & CURETTAGE/HYSTEROSCOPY WITH THERMACHOICE ABLATION (Total Therapy Time=64minutes);  Surgeon: Florian Buff, MD;  Location: [facility];  Service: Gynecology;  Laterality: N/A;  . POLYPECTOMY N/A 05/18/2013   Procedure: ENDOMETRIAL POLYPECTOMY;  Surgeon: Florian Buff, MD;  Location: [facility];  Service: Gynecology;  Laterality: N/A;   Allergies  Allergen Reactions  . Bee Venom   . Onion     Blisters in mouth and bloating   No current facility-administered medications on file prior to encounter.   Current Outpatient Medications on File Prior to Encounter  Medication Sig Dispense Refill  . ibuprofen (ADVIL) 800 MG tablet Take 1 tablet (800 mg total) by mouth every 8 (eight) hours as needed. 45 tablet 0  . norethindrone (MICRONOR) 0.35 MG tablet Take 1 tablet (0.35 mg total) by mouth daily. 28 tablet 12  . pantoprazole (PROTONIX) 40 MG tablet Take 1 tablet (40 mg total) by mouth daily. 30 tablet 2    Social History   Socioeconomic History  . Marital status: Single    Spouse name: Not on file  . Number of children: Not on file  . Years of education: Not on file  . Highest education level: Not on file  Occupational History  . Not on file  Tobacco Use  . Smoking status: Current Every Day Smoker    Packs/day: 0.50    Years: 20.00    Pack years: 10.00    Types: Cigarettes  . Smokeless tobacco: Never Used  . Tobacco comment: trying to quit  Substance and Sexual Activity  . Alcohol use: No  . Drug use: No    Comment: No Hx IV Abuse  . Sexual activity: Yes    Birth control/protection: None, Other-see comments  Other Topics Concern  . Not on file  Social History Narrative  . Not on file   Social Determinants of Health   Financial Resource Strain: Not on file  Food Insecurity: Not on file  Transportation Needs: Not on file  Physical Activity: Not on file  Stress: Not on file  Social Connections: Not on file  Intimate Partner Violence: Not on file   Family History  Problem Relation Age of Onset  . Hypertension Father   . Multiple sclerosis Mother   . Cancer Maternal Aunt        uterine and lung  . Cancer Paternal Uncle  leukemia  . Cancer Maternal Grandmother        breast  . Heart disease Paternal Grandmother     OBJECTIVE:  Vitals:   10/01/20 1829  BP: (!) 104/55  Pulse: 84  Resp: 18  Temp: 97.7 F (36.5 C)  TempSrc: Oral  SpO2: 98%     Physical Exam Vitals and nursing note reviewed.  Constitutional:      General: She is not in acute distress.    Appearance: Normal appearance. She is normal weight. She is not ill-appearing, toxic-appearing or diaphoretic.  HENT:     Head: Normocephalic.  Cardiovascular:     Rate and Rhythm: Normal rate and regular rhythm.     Pulses: Normal pulses.     Heart sounds: Normal heart sounds. No murmur heard. No friction rub. No gallop.   Pulmonary:     Effort: Pulmonary effort is normal. No respiratory  distress.     Breath sounds: Normal breath sounds. No stridor. No wheezing, rhonchi or rales.  Chest:     Chest wall: No tenderness.  Musculoskeletal:        General: Tenderness present.     Lumbar back: Tenderness present. No swelling, deformity, lacerations or spasms. Positive right straight leg raise test.  Neurological:     Mental Status: She is alert and oriented to person, place, and time.      LABS:  No results found for this or any previous visit (from the past 24 hour(s)).   ASSESSMENT & PLAN:  1. Sciatica, right side   2. Lumbar radiculopathy     Meds ordered this encounter  Medications  . dexamethasone (DECADRON) injection 10 mg  . predniSONE (DELTASONE) 20 MG tablet    Sig: Take 3 tab p.o. x 2 days, then 2 tab x 2 days, then 1 tab x 2 days.  Take with food.    Dispense:  12 tablet    Refill:  0    Discharge instructions  Rest, ice and heat as needed Ensure adequate ROM as tolerated. Decadron IM was given in office Prescribed prednisone Continue to take OTC Tylenol as needed for pain Return here or go to ER if you have any new or worsening symptoms such as numbness/tingling of the inner thighs, loss of bladder or bowel control, headache/blurry vision, nausea/vomiting, confusion/altered mental status, dizziness, weakness, passing out, imbalance, etc...    Reviewed expectations re: course of current medical issues. Questions answered. Outlined signs and symptoms indicating need for more acute intervention. Patient verbalized understanding. After Visit Summary given.         Emerson Monte, FNP 10/01/20 1900

## 2020-10-09 ENCOUNTER — Encounter: Payer: Self-pay | Admitting: Family Medicine

## 2020-10-09 ENCOUNTER — Telehealth: Payer: Self-pay | Admitting: Family Medicine

## 2020-10-09 ENCOUNTER — Ambulatory Visit (HOSPITAL_COMMUNITY)
Admission: RE | Admit: 2020-10-09 | Discharge: 2020-10-09 | Disposition: A | Discharge status: Home / Self Care | Payer: Managed Care, Other (non HMO) | Source: Ambulatory Visit | Attending: Family Medicine | Admitting: Family Medicine

## 2020-10-09 ENCOUNTER — Other Ambulatory Visit: Payer: Self-pay

## 2020-10-09 ENCOUNTER — Ambulatory Visit: Payer: Managed Care, Other (non HMO) | Admitting: Family Medicine

## 2020-10-09 VITALS — BP 125/80 | HR 87 | Temp 98.0°F

## 2020-10-09 DIAGNOSIS — M25551 Pain in right hip: Secondary | ICD-10-CM | POA: Diagnosis present

## 2020-10-09 DIAGNOSIS — M25561 Pain in right knee: Secondary | ICD-10-CM

## 2020-10-09 MED ORDER — HYDROCODONE-ACETAMINOPHEN 5-325 MG PO TABS: 1.0000 | ORAL_TABLET | Freq: Four times a day (QID) | ORAL | 0 refills | Status: DC | PRN

## 2020-10-09 NOTE — Telephone Encounter (Signed)
Pls get more info.  How many days?  Fever? Drainage from nose?  Coughing?   Usually I don't treat sinus infections with antibiotics unless more symptoms.   Would recommed allergy meds and flonase. And call back in 2-3 days if not improving.  Dr. Lovena Le

## 2020-10-09 NOTE — Progress Notes (Addendum)
Patient ID: Emma Johnson, female    DOB: 02/25/1976, 45 y.o.   MRN: [id number]    Chief Complaint  Patient presents with  . Hip Pain   Subjective:    HPI  Cc- f/u rt hip/sciatica-  Pt here for right hip pain. Pt went to Urgent Care on 10/01/20. Pt has been on 2 rounds of prednisone, dexamethasone shot, tylenol, advil. 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 Falls due to pain.  Unable to sleep. Lower right leg is numb at times; pain is radiating to lower leg at times. Ice and TENS unit is the only thing that really works.   09/18/20- took prednisone, by [place] seen for sciatic nerve pain on rt side. Stating didn't get better.  ewnt to urgent care.  Had dexmethasone inj and prednisone again. Pt stating didn't really help.  Missing a lot of work due to not being able to stand on the leg.   Taking robaxin and meloxicam.  Had 2 rounds prednisone and dexmethasone  Tens unit and ice.   Urinating on herself due to not being able to get to bathroom on time due to pain in leg.  Not having full incontinence.  - falling down once since having pain in the rt leg.  Hard to walk on the leg or stand on the leg.  Fall onto brick steps, about 1 wk ago, hit rt knee and has healing scab. Before seeing urgent care.   Hard to lay flat and back due to pain. Stating its her rt hip with the most pain. Sharp pain that comes and goes.  Radiation of pain to the groin. No pain down the leg at this time. But on exam saying knee hurting and calf hurting at times. No direct trauma to back or hip.  No surgeries on these areas.   Medical History Emma Johnson has a past medical history of AMA (advanced maternal age) multigravida 35+, Depression, Fibroids, and No pertinent past medical history.   Outpatient Encounter Medications as of 10/09/2020  Medication Sig  . HYDROcodone-acetaminophen (NORCO) 5-325 MG tablet Take 1-2 tablets by mouth every 6 (six) hours as needed for moderate pain.  Marland Kitchen ibuprofen  (ADVIL) 800 MG tablet Take 1 tablet (800 mg total) by mouth every 8 (eight) hours as needed.  . norethindrone (MICRONOR) 0.35 MG tablet Take 1 tablet (0.35 mg total) by mouth daily.  . pantoprazole (PROTONIX) 40 MG tablet Take 1 tablet (40 mg total) by mouth daily.  . predniSONE (DELTASONE) 20 MG tablet Take 3 tab p.o. x 2 days, then 2 tab x 2 days, then 1 tab x 2 days.  Take with food.   No facility-administered encounter medications on file as of 10/09/2020.     Review of Systems  Constitutional: Negative for chills and fever.  HENT: Negative for congestion, rhinorrhea and sore throat.   Respiratory: Negative for cough, shortness of breath and wheezing.   Cardiovascular: Negative for chest pain and leg swelling.  Gastrointestinal: Negative for abdominal pain, diarrhea, nausea and vomiting.  Genitourinary: Negative for dysuria and frequency.  Musculoskeletal: Positive for arthralgias (rt hip pain, rt scaral pain and rt knee pain). Negative for back pain.  Skin: Negative for rash.  Neurological: Negative for dizziness, weakness and headaches.     Vitals BP 125/80   Pulse 87   Temp 98 F (36.7 C)   SpO2 98%   Objective:   Physical Exam Vitals and nursing note reviewed.  Constitutional:  General: She is in acute distress (due to pain).     Appearance: Normal appearance.  Pulmonary:     Effort: Pulmonary effort is normal.  Musculoskeletal:     Comments: +dec rom with rotation of rt hip.  Normal rom with rt knee, small healing scab on knee, no erythema, wamrth, or rash. No ttp over greater trochanter on rt.  +ttp over piriformis on rt.  No T or L tenderness over spinous process.  Dec rom with flexion and extension of back.  Skin:    General: Skin is warm and dry.     Findings: No rash.  Neurological:     General: No focal deficit present.     Mental Status: She is alert and oriented to person, place, and time.     Cranial Nerves: No cranial nerve deficit.      Sensory: No sensory deficit.     Motor: No weakness.     Gait: Gait abnormal (due to pain in rt hip).     Comments: Normal sensation on rt lower leg.  Psychiatric:        Mood and Affect: Mood normal.        Behavior: Behavior normal.      Assessment and Plan   1. Right hip pain - DG Lumbar Spine Complete; Future - DG Hip Unilat W OR W/O Pelvis Min 4 Views Right; Future - HYDROcodone-acetaminophen (NORCO) 5-325 MG tablet; Take 1-2 tablets by mouth every 6 (six) hours as needed for moderate pain.  Dispense: 45 tablet; Refill: 0 - Ambulatory referral to Orthopedic Surgery  2. Acute pain of right knee - DG Knee Complete 4 Views Right; Future    Pt with severe rt hip pain.  Will send for urgent consult to [facility]. Will order xrays.  Pt already had 2 rounds of prednisone w/o much relief.  advising to take norco and robaxin and rest/heat/ice.   Pt likely needs MRI, but will start with xrays and referral to ortho.  Pt given note for work for light duty, per her request.  Pt in agreement.  Return in about 2 weeks (around 10/23/2020) for f/u rt hip pain.

## 2020-10-09 NOTE — Telephone Encounter (Signed)
Pt would like [facility] called in to [facility]. Pt having some under eye pressure. States that she feels like she is developing a sinus infection. Please advise. Thank you

## 2020-10-10 ENCOUNTER — Emergency Department (HOSPITAL_COMMUNITY): Payer: Managed Care, Other (non HMO)

## 2020-10-10 ENCOUNTER — Emergency Department (HOSPITAL_COMMUNITY)
Admission: EM | Admit: 2020-10-10 | Discharge: 2020-10-10 | Disposition: A | Discharge status: Home / Self Care | Payer: Managed Care, Other (non HMO) | Attending: Emergency Medicine | Admitting: Emergency Medicine

## 2020-10-10 ENCOUNTER — Encounter (HOSPITAL_COMMUNITY): Payer: Self-pay | Admitting: Emergency Medicine

## 2020-10-10 ENCOUNTER — Other Ambulatory Visit: Payer: Self-pay

## 2020-10-10 DIAGNOSIS — M549 Dorsalgia, unspecified: Secondary | ICD-10-CM | POA: Diagnosis not present

## 2020-10-10 DIAGNOSIS — U071 COVID-19: Secondary | ICD-10-CM

## 2020-10-10 DIAGNOSIS — R109 Unspecified abdominal pain: Secondary | ICD-10-CM | POA: Diagnosis not present

## 2020-10-10 DIAGNOSIS — F17210 Nicotine dependence, cigarettes, uncomplicated: Secondary | ICD-10-CM | POA: Insufficient documentation

## 2020-10-10 DIAGNOSIS — M25551 Pain in right hip: Secondary | ICD-10-CM | POA: Diagnosis not present

## 2020-10-10 DIAGNOSIS — M5416 Radiculopathy, lumbar region: Secondary | ICD-10-CM

## 2020-10-10 LAB — CBC WITH DIFFERENTIAL/PLATELET
Abs Immature Granulocytes: 0.05 10*3/uL (ref 0.00–0.07)
Basophils Absolute: 0.1 10*3/uL (ref 0.0–0.1)
Basophils Relative: 1 %
Eosinophils Absolute: 0.1 10*3/uL (ref 0.0–0.5)
Eosinophils Relative: 1 %
HCT: 44.1 % (ref 36.0–46.0)
Hemoglobin: 14.2 g/dL (ref 12.0–15.0)
Immature Granulocytes: 1 %
Lymphocytes Relative: 16 %
Lymphs Abs: 1.7 10*3/uL (ref 0.7–4.0)
MCH: 30.1 pg (ref 26.0–34.0)
MCHC: 32.2 g/dL (ref 30.0–36.0)
MCV: 93.6 fL (ref 80.0–100.0)
Monocytes Absolute: 0.9 10*3/uL (ref 0.1–1.0)
Monocytes Relative: 8 %
Neutro Abs: 8.1 10*3/uL — ABNORMAL HIGH (ref 1.7–7.7)
Neutrophils Relative %: 73 %
Platelets: 176 10*3/uL (ref 150–400)
RBC: 4.71 MIL/uL (ref 3.87–5.11)
RDW: 15.5 % (ref 11.5–15.5)
WBC: 10.8 10*3/uL — ABNORMAL HIGH (ref 4.0–10.5)
nRBC: 0 % (ref 0.0–0.2)

## 2020-10-10 LAB — URINALYSIS, ROUTINE W REFLEX MICROSCOPIC
Bilirubin Urine: NEGATIVE
Glucose, UA: NEGATIVE mg/dL
Hgb urine dipstick: NEGATIVE
Ketones, ur: NEGATIVE mg/dL
Leukocytes,Ua: NEGATIVE
Nitrite: NEGATIVE
Protein, ur: NEGATIVE mg/dL
Specific Gravity, Urine: 1.019 (ref 1.005–1.030)
pH: 6 (ref 5.0–8.0)

## 2020-10-10 LAB — BASIC METABOLIC PANEL
Anion gap: 9 (ref 5–15)
BUN: 15 mg/dL (ref 6–20)
CO2: 24 mmol/L (ref 22–32)
Calcium: 8.3 mg/dL — ABNORMAL LOW (ref 8.9–10.3)
Chloride: 106 mmol/L (ref 98–111)
Creatinine, Ser: 0.67 mg/dL (ref 0.44–1.00)
GFR, Estimated: >60 mL/min (ref >60)
Glucose, Bld: 99 mg/dL (ref 70–99)
Potassium: 3.7 mmol/L (ref 3.5–5.1)
Sodium: 139 mmol/L (ref 135–145)

## 2020-10-10 LAB — PREGNANCY, URINE: Preg Test, Ur: NEGATIVE

## 2020-10-10 LAB — CK: Total CK: 40 U/L (ref 38–234)

## 2020-10-10 MED ORDER — OXYCODONE-ACETAMINOPHEN 5-325 MG PO TABS: 1.0000 | ORAL_TABLET | Freq: Four times a day (QID) | ORAL | 0 refills | Status: DC | PRN

## 2020-10-10 MED ORDER — DEXAMETHASONE SODIUM PHOSPHATE 10 MG/ML IJ SOLN
10.0000 mg | Freq: Once | INTRAMUSCULAR | Status: AC
  Administered 2020-10-10: 10 mg via INTRAVENOUS
  Filled 2020-10-10: qty 1

## 2020-10-10 MED ORDER — KETOROLAC TROMETHAMINE 30 MG/ML IJ SOLN
30.0000 mg | Freq: Once | INTRAMUSCULAR | Status: AC
  Administered 2020-10-10: 30 mg via INTRAVENOUS
  Filled 2020-10-10: qty 1

## 2020-10-10 MED ORDER — HYDROMORPHONE HCL 1 MG/ML IJ SOLN
1.0000 mg | Freq: Once | INTRAMUSCULAR | Status: AC
  Administered 2020-10-10: 1 mg via INTRAVENOUS
  Filled 2020-10-10: qty 1

## 2020-10-10 MED ORDER — METHOCARBAMOL 1000 MG/10ML IJ SOLN
INTRAMUSCULAR | Status: AC
  Filled 2020-10-10: qty 10

## 2020-10-10 MED ORDER — METHOCARBAMOL 500 MG PO TABS: 500.0000 mg | ORAL_TABLET | Freq: Three times a day (TID) | ORAL | 0 refills | Status: DC | PRN

## 2020-10-10 MED ORDER — METHOCARBAMOL 1000 MG/10ML IJ SOLN
500.0000 mg | Freq: Once | INTRAVENOUS | Status: AC
  Administered 2020-10-10: 500 mg via INTRAVENOUS
  Filled 2020-10-10: qty 5

## 2020-10-10 NOTE — ED Provider Notes (Signed)
[facility] EMERGENCY DEPARTMENT Provider Note   CSN: [id number] Arrival date & time: 10/10/20  0453     History Chief Complaint  Patient presents with  . Back Pain    Emma Johnson is a 45 y.o. female.  Patient arrives via EMS with progressively worsening right groin and hip pain and pressure since mid January.  Reports no trauma or fall.  Reports pain is becoming more more unbearable.  She was seen in urgent care in February and seen by her PCP yesterday.  She was treated twice with steroids for possible sciatica.  She denies any back pain.  She describes pain in her right groin her right thigh that feels like a pressure.  There is some radiation of the pain down her leg causing severe pain.  There is no numbness or tingling.  She had one episode of dribbling yesterday when she tried to go to the bathroom but knew it was coming.  Denies losing control of her bladder otherwise.  No fevers, pain with urination no blood in the urine.  No history of cancer or IV drug abuse.  She had x-rays as an outpatient that were negative.  She does not recall any pain.  No previous surgery.  She is never had this problem before.  States the pain is gotten so bad that she is unable to walk over the past 2 days and has been trying to help with crutches.  She is unable to raise her leg off the bed.  She is taking hydrocodone at home which is a new medication as of yesterday.  She was on steroids last week.  The history is provided by the patient and the EMS personnel.  Back Pain Associated symptoms: no abdominal pain, no chest pain, no dysuria, no fever, no headaches and no weakness        Past Medical History:  Diagnosis Date  . AMA (advanced maternal age) multigravida 56+   . Depression   . Fibroids   . No pertinent past medical history     Patient Active Problem List   Diagnosis Date Noted  . Dysmenorrhea 04/15/2018  . Pelvic pain in female 04/15/2018  . Excessive or frequent  menstruation 05/12/2013  . 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 Obsessive compulsive disorder 01/03/2013  . Generalized anxiety disorder 11/12/2012    Past Surgical History:  Procedure Laterality Date  . DILITATION & CURRETTAGE/HYSTROSCOPY WITH THERMACHOICE ABLATION N/A 05/18/2013   Procedure: DILATATION & CURETTAGE/HYSTEROSCOPY WITH THERMACHOICE ABLATION (Total Therapy Time=64minutes);  Surgeon: Florian Buff, MD;  Location: [facility];  Service: Gynecology;  Laterality: N/A;  . POLYPECTOMY N/A 05/18/2013   Procedure: ENDOMETRIAL POLYPECTOMY;  Surgeon: Florian Buff, MD;  Location: [facility];  Service: Gynecology;  Laterality: N/A;     OB History    Gravida  5   Para  2   Term  2   Preterm      AB  2   Living  2     SAB      IAB  2   Ectopic      Multiple      Live Births  2           Family History  Problem Relation Age of Onset  . Hypertension Father   . Multiple sclerosis Mother   . Cancer Maternal Aunt        uterine and lung  . Cancer Paternal Uncle  leukemia  . Cancer Maternal Grandmother        breast  . Heart disease Paternal Grandmother     Social History   Tobacco Use  . Smoking status: Current Every Day Smoker    Packs/day: 0.50    Years: 20.00    Pack years: 10.00    Types: Cigarettes  . Smokeless tobacco: Never Used  . Tobacco comment: trying to quit  Substance Use Topics  . Alcohol use: No  . Drug use: No    Comment: No Hx IV Abuse    Home Medications Prior to Admission medications   Medication Sig Start Date End Date Taking? Authorizing Provider  HYDROcodone-acetaminophen (NORCO) 5-325 MG tablet Take 1-2 tablets by mouth every 6 (six) hours as needed for moderate pain. 10/09/20   Lovena Le, Malena M, DO  ibuprofen (ADVIL) 800 MG tablet Take 1 tablet (800 mg total) by mouth every 8 (eight) hours as needed. 09/18/20   Erven Colla, DO  norethindrone (MICRONOR) 0.35 MG tablet Take 1 tablet (0.35 mg total) by mouth daily. 06/21/20    Florian Buff, MD  pantoprazole (PROTONIX) 40 MG tablet Take 1 tablet (40 mg total) by mouth daily. 09/18/20   Lovena Le, Malena M, DO  predniSONE (DELTASONE) 20 MG tablet Take 3 tab p.o. x 2 days, then 2 tab x 2 days, then 1 tab x 2 days.  Take with food. 10/01/20   Avegno, Darrelyn Hillock, FNP    Allergies    Bee venom and Onion  Review of Systems   Review of Systems  Constitutional: Negative for activity change, appetite change, fatigue and fever.  HENT: Negative for congestion and rhinorrhea.   Eyes: Negative for photophobia.  Respiratory: Negative for cough, chest tightness and shortness of breath.   Cardiovascular: Negative for chest pain.  Gastrointestinal: Negative for abdominal pain, nausea and vomiting.  Genitourinary: Negative for dysuria and hematuria.  Musculoskeletal: Positive for arthralgias, back pain and myalgias.  Skin: Negative for rash.  Neurological: Negative for dizziness, weakness and headaches.   all other systems are negative except as noted in the HPI and PMH.    Physical Exam Updated Vital Signs BP 127/73   Pulse 86   Temp 98.4 F (36.9 C)   Resp 18   Ht 5\' 10"  (1.778 m)   Wt 92.1 kg   SpO2 96%   BMI 29.13 kg/m   Physical Exam Vitals and nursing note reviewed.  Constitutional:      General: She is not in acute distress.    Appearance: She is well-developed and well-nourished.     Comments: Very tearful  HENT:     Head: Normocephalic and atraumatic.     Mouth/Throat:     Mouth: Oropharynx is clear and moist.     Pharynx: No oropharyngeal exudate.  Eyes:     Extraocular Movements: EOM normal.     Conjunctiva/sclera: Conjunctivae normal.     Pupils: Pupils are equal, round, and reactive to light.  Neck:     Comments: No meningismus. Cardiovascular:     Rate and Rhythm: Normal rate and regular rhythm.     Pulses: Intact distal pulses.     Heart sounds: Normal heart sounds. No murmur heard.   Pulmonary:     Effort: Pulmonary effort is normal. No  respiratory distress.     Breath sounds: Normal breath sounds.  Abdominal:     Palpations: Abdomen is soft.     Tenderness: There is no abdominal tenderness. There  is no guarding or rebound.  Musculoskeletal:        General: Tenderness present. No edema.     Cervical back: Normal range of motion and neck supple.     Comments: No tenderness to palpation of T or L-spine.  No pain at sciatic notch.  She is unable to raise right leg off the bed.  She is able to wiggle her toes.  Pain with flexion and extension of her ankle with decreased strength compared to left side.  Right thigh appears normal to inspection.  There is no overlying erythema or edema.   5/5 strength of of left leg including ankle flexion and extension.  Great toe extension intact.  Able to lift left leg off the bed without difficulty.  +2 DP and PT pulses bilaterally  Skin:    General: Skin is warm.  Neurological:     Mental Status: She is alert.     Cranial Nerves: No cranial nerve deficit.     Motor: No abnormal muscle tone.     Coordination: Coordination normal.     Comments: Equal grip strength bilaterally.  Cranial nerves II to XII intact.    Lower extremity exam as above.  Unable to lift right leg off the bed but able to wiggle toes and flex and extend ankle  Psychiatric:        Mood and Affect: Mood and affect normal.        Behavior: Behavior normal.     ED Results / Procedures / Treatments   Labs (all labs ordered are listed, but only abnormal results are displayed) Labs Reviewed  CBC WITH DIFFERENTIAL/PLATELET - Abnormal; Notable for the following components:      Result Value   WBC 10.8 (*)    Neutro Abs 8.1 (*)    All other components within normal limits  BASIC METABOLIC PANEL - Abnormal; Notable for the following components:   Calcium 8.3 (*)    All other components within normal limits  CK  URINALYSIS, ROUTINE W REFLEX MICROSCOPIC  PREGNANCY, URINE    EKG None  Radiology DG Lumbar  Spine Complete  Result Date: 10/09/2020 CLINICAL DATA:  Lumbar pain EXAM: LUMBAR SPINE - COMPLETE 4+ VIEW COMPARISON:  None. FINDINGS: Five view radiograph lumbar spine. Five non rib bearing segments of the lumbar spine. No acute fracture or listhesis. Vertebral body height has been preserved. There is mild intervertebral disc space narrowing and endplate remodeling at B9-3 in keeping with changes of mild degenerative disc disease. Intervertebral disc heights have been otherwise preserved. Oblique views demonstrate no evidence of pars defect. IMPRESSION: No acute fracture or listhesis.  Mild degenerative disc disease L2-3 Electronically Signed   By: Fidela Salisbury MD   On: 10/09/2020 23:51   DG Knee Complete 4 Views Right  Result Date: 10/09/2020 CLINICAL DATA:  Right knee pain EXAM: RIGHT KNEE - COMPLETE 4+ VIEW COMPARISON:  None. FINDINGS: No evidence of fracture, dislocation, or joint effusion. No evidence of arthropathy or other focal bone abnormality. Soft tissues are unremarkable. IMPRESSION: Negative. Electronically Signed   By: Fidela Salisbury MD   On: 10/09/2020 23:52   DG Hip Unilat W OR W/O Pelvis Min 4 Views Right  Result Date: 10/09/2020 CLINICAL DATA:  Right hip pain EXAM: DG HIP (WITH OR WITHOUT PELVIS) 4+V RIGHT COMPARISON:  None. FINDINGS: There is no evidence of hip fracture or dislocation. There is no evidence of arthropathy or other focal bone abnormality. IMPRESSION: Negative. Electronically Signed  By: Fidela Salisbury MD   On: 10/09/2020 23:52    Procedures Procedures   Medications Ordered in ED Medications  HYDROmorphone (DILAUDID) injection 1 mg (has no administration in time range)  methocarbamol (ROBAXIN) 500 mg in dextrose 5 % 50 mL IVPB (has no administration in time range)    ED Course  I have reviewed the triage vital signs and the nursing notes.  Pertinent labs & imaging results that were available during my care of the patient were reviewed by me and considered in  my medical decision making (see chart for details).    MDM Rules/Calculators/A&P                         Recently worsening right-sided hip pain and groin pain for the past 6 weeks.  No trauma.  Outpatient x-rays are negative.  Distal pulses are intact.  Patient treated as an outpatient for sciatica although her description for sciatica is atypical.  There is no radiation of the pain down her leg and no low back pain and no pain going down her leg.  Patient given medications muscle relaxers. Patient is atypical for sciatica when she has severe pain in her hip and leg and is unable to lift them off the bed unable to walk.  We will proceed with MRI to evaluate for spinal cord pathology or hip pathology.  Care will be transferred to oncoming team at shift change Final Clinical Impression(s) / ED Diagnoses Final diagnoses:  None    Rx / DC Orders ED Discharge Orders    None       Romana Deaton, Annie Main, MD 10/10/20 [phone]

## 2020-10-10 NOTE — ED Provider Notes (Signed)
Signout from Dr. Kingsley Callander.  45 year old female with right groin and hip pain since January.  Plain film imaging unremarkable.  She is pending MRI lumbar and right hip.  Disposition per results of testing. Physical Exam  BP 127/73   Pulse 86   Temp 98.4 F (36.9 C)   Resp 18   Ht 5\' 10"  (1.778 m)   Wt 92.1 kg   SpO2 96%   BMI 29.13 kg/m   Physical Exam  ED Course/Procedures     Procedures  MDM  Informed by nurse that the patient found that she was Covid positive from a prior test.  IMPRESSION:  1. Assuming hypoplastic T12 ribs the lowest open disc space is  numbered L5-S1.  2. Symptomatic finding is presumably at L3-4 where there is a right  foraminal to far-lateral extrusion with L3 impingement.  3. L2-3 right foraminal to extraforaminal protrusion contacting the  right L2 nerve root.  4. L5-S1 left foraminal protrusion impinging on the left L5 nerve  root.     IMPRESSION:  1. Mild tendinosis of the right gluteus minimus tendon with edema at  the myotendinous junction, which may reflect low-grade strain.  2. Otherwise unremarkable MRI of the right hip.    Discussed with neurosurgery PA Meyron.  Recommends 10 of Decadron IV and pain control and can be seen in the office tomorrow.  Reviewed results with patient and she is agreeable to plan.  She said she has to test at work for [employer] twice a week and was not having any symptoms.  She is previously vaccinated.  I asked that when the office calls her for appointment that she please let them know.  Provide prescriptions for some further narcotic pain medicine and muscle relaxant.  Return instructions discussed      Hayden Rasmussen, MD 10/10/20 [phone]

## 2020-10-10 NOTE — Discharge Instructions (Signed)
You were seen in the emergency department for continued right hip groin and thigh pain.  You had an MRI that showed multiple disks that were pressing on the nerve.  Neurosurgery is recommending a dose of steroids here in the department and then pain medicine.  Their clinic should call you with follow-up.  We are prescribing you a different pain medicine and a muscle relaxant.  Please let the clinic know that you tested positive for Covid.  Return to the emergency department if any worsening or concerning symptoms

## 2020-10-10 NOTE — Telephone Encounter (Signed)
Pt contacted. Pt currently at ED with leg pain. Pt verbalized understanding

## 2020-10-10 NOTE — ED Triage Notes (Signed)
Pt c/o back/right hip pain x 4 weeks ago.

## 2020-10-11 ENCOUNTER — Telehealth: Payer: Self-pay | Admitting: *Deleted

## 2020-10-11 NOTE — Telephone Encounter (Signed)
Transition Care Management Unsuccessful Follow-up Telephone Call  Date of discharge and from where:  10/10/2020 - [facility]  Attempts:  1st Attempt  Reason for unsuccessful TCM follow-up call:  No answer/busy   Has pending OV with PCP on 10/23/2020 at [place].

## 2020-10-12 ENCOUNTER — Ambulatory Visit: Payer: Managed Care, Other (non HMO) | Admitting: Family Medicine

## 2020-10-12 NOTE — Telephone Encounter (Signed)
Transition Care Management Unsuccessful Follow-up Telephone Call  Date of discharge and from where:  10/10/2020 - [facility]  Attempts:  2nd Attempt  Reason for unsuccessful TCM follow-up call:  No answer/busy

## 2020-10-15 NOTE — Telephone Encounter (Signed)
Transition Care Management Follow-up Telephone Call  Date of discharge and from where: 10/10/2020 - [facility]  How have you been since you were released from the hospital? "I am doing okay"  Any questions or concerns? No  Items Reviewed:  Did the pt receive and understand the discharge instructions provided? Yes   Medications obtained and verified? Yes   Other? No   Any new allergies since your discharge? No   Dietary orders reviewed? Yes  Do you have support at home? Yes   Home Care and Equipment/Supplies: Were home health services ordered? not applicable If so, what is the name of the agency? N/A  Has the agency set up a time to come to the patient's home? not applicable Were any new equipment or medical supplies ordered?  No What is the name of the medical supply agency? N/A Were you able to get the supplies/equipment? not applicable Do you have any questions related to the use of the equipment or supplies? No  Functional Questionnaire: (I = Independent and D = Dependent) ADLs: I  Bathing/Dressing- I  Meal Prep- I  Eating- I  Maintaining continence- I  Transferring/Ambulation- I  Managing Meds- I  Follow up appointments reviewed:   PCP Hospital f/u appt confirmed? Yes  Scheduled to see PCP on 10/23/2020 @ 0840.  [facility] f/u appt confirmed? No    Are transportation arrangements needed? No   If their condition worsens, is the pt aware to call PCP or go to the Emergency Dept.? Yes  Was the patient provided with contact information for the PCP's office or ED? Yes  Was to pt encouraged to call back with questions or concerns? Yes

## 2020-10-17 ENCOUNTER — Telehealth: Payer: Self-pay | Admitting: Family Medicine

## 2020-10-17 MED ORDER — HYDROCORTISONE ACETATE 25 MG RE SUPP: 25.0000 mg | Freq: Two times a day (BID) | RECTAL | 0 refills | Status: DC

## 2020-10-17 MED ORDER — HYDROCODONE-ACETAMINOPHEN 5-325 MG PO TABS: 1.0000 | ORAL_TABLET | Freq: Three times a day (TID) | ORAL | 0 refills | Status: DC | PRN

## 2020-10-17 NOTE — Telephone Encounter (Signed)
Nurse- patient is requesting a nurse to call her about her trip to ER and seeing Dr. Lovena Le last week wont leave details wanting to speak directly to nurse.

## 2020-10-17 NOTE — Telephone Encounter (Signed)
Pt contacted. Pt went to ER and had MRI done. Pt states MRI showed multiple things. Pt right leg is numb at this time. Has appt with neurosurgery tomorrow. Pt states she is still unable to walk. Pt would refill of Hydrocodone 5-325 mg and would also like some hydrocortisone suppositories called into [facility]. Pt states she is taking stool softeners but not helping well. Please advise. Thank you

## 2020-10-17 NOTE — Telephone Encounter (Signed)
Pt contacted and verbalized understanding.  

## 2020-10-17 NOTE — Telephone Encounter (Signed)
Will give small course of pain medications. Pt needs to get further pain medications from the neurosurgeon.   Thanks,   Dr. Lovena Le

## 2020-10-23 ENCOUNTER — Encounter: Payer: Self-pay | Admitting: Family Medicine

## 2020-10-23 ENCOUNTER — Ambulatory Visit: Payer: Managed Care, Other (non HMO) | Admitting: Family Medicine

## 2020-10-23 ENCOUNTER — Telehealth: Payer: Self-pay

## 2020-10-23 MED ORDER — NYSTATIN 100000 UNIT/ML MT SUSP: 5.0000 mL | Freq: Four times a day (QID) | OROMUCOSAL | 0 refills | Status: DC

## 2020-10-23 NOTE — Telephone Encounter (Signed)
Patient notified and verbalized understanding. 

## 2020-10-23 NOTE — Telephone Encounter (Signed)
Patient called at 8:30 stating she couldn't come for her 8:40 appt because her hip hurt too bad to drive.  She said to tell Dr. Lovena Le she has a spinal injection schedule this week and is having back surgery on 3/23.  She also asked that something be called in for thrush because she has it really bad from taking all her medicines.  [facility]

## 2020-10-30 HISTORY — PX: LAMINECTOMY AND MICRODISCECTOMY LUMBAR SPINE: SHX1913

## 2020-11-06 ENCOUNTER — Other Ambulatory Visit: Payer: Self-pay | Admitting: Family Medicine

## 2021-07-09 ENCOUNTER — Other Ambulatory Visit: Payer: Self-pay | Admitting: Obstetrics & Gynecology

## 2021-07-28 ENCOUNTER — Other Ambulatory Visit: Payer: Self-pay | Admitting: Family Medicine

## 2021-07-28 DIAGNOSIS — M5416 Radiculopathy, lumbar region: Secondary | ICD-10-CM

## 2021-09-27 ENCOUNTER — Ambulatory Visit: Payer: Managed Care, Other (non HMO) | Admitting: Family Medicine

## 2022-02-01 IMAGING — MR MR HIP*R* W/O CM
6 series · 41 of 48 positions shown · non-contrast
Comparison: X-ray 10/09/2020

CLINICAL DATA: Right hip pain for 4 weeks.  No known injury.

EXAM:
MR OF THE RIGHT HIP WITHOUT CONTRAST
TECHNIQUE: Multiplanar, multisequence MR imaging was performed. No intravenous
contrast was administered.

[Series 12: T1 · coronal · 4.0mm · 0.78mm/px · 8 of 38 slices shown]
[im 1/38]
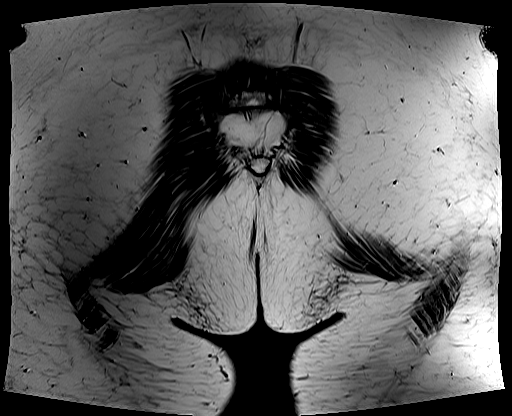
[im 5/38]
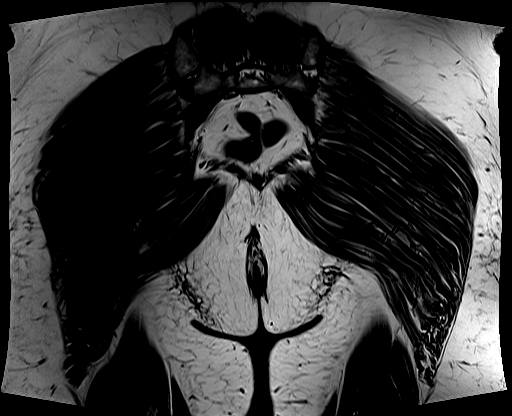
[im 13/38]
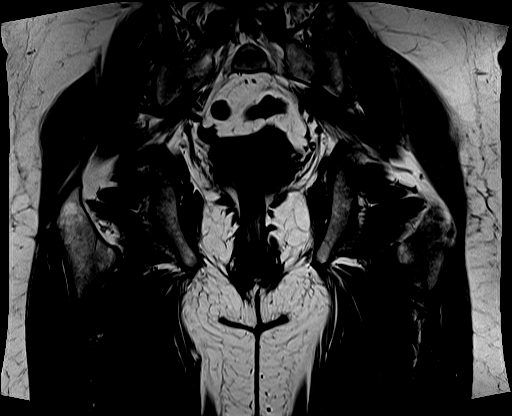
[im 17/38]
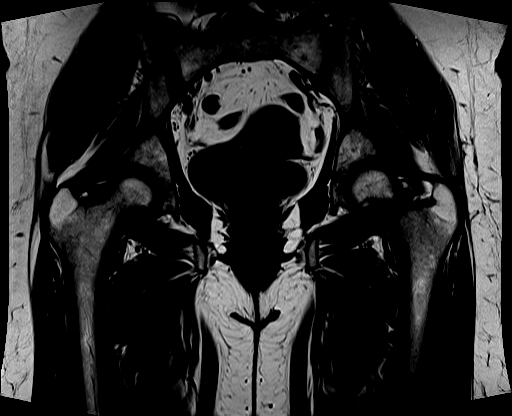
[im 21/38]
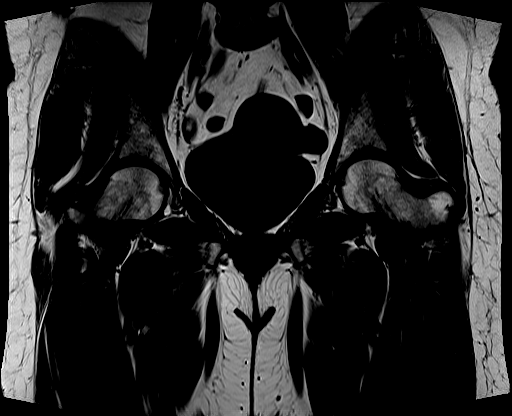
[im 25/38]
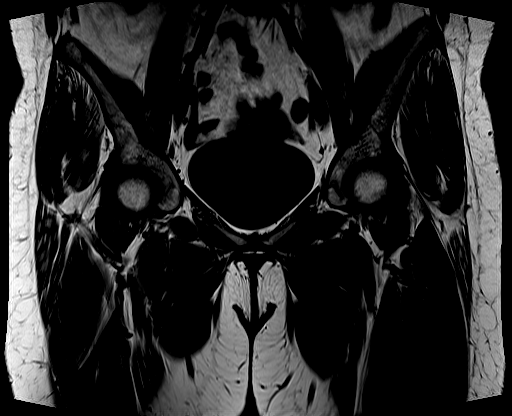
[im 33/38]
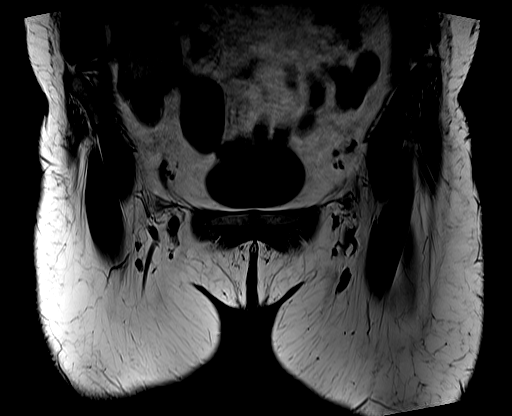
[im 38/38]
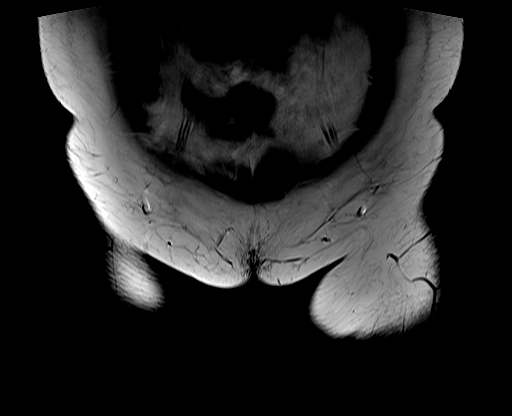

[Series 13: STIR · coronal · 4.0mm · 1.04mm/px · 9 of 38 slices shown]
[im 1/38]
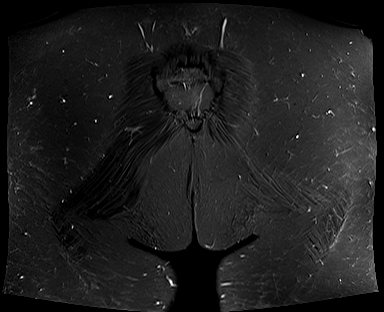
[im 5/38]
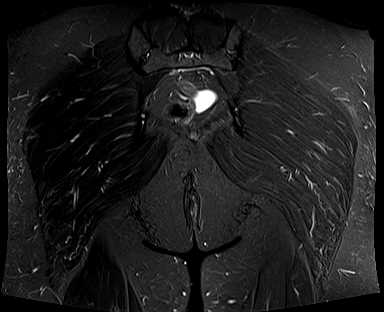
[im 10/38]
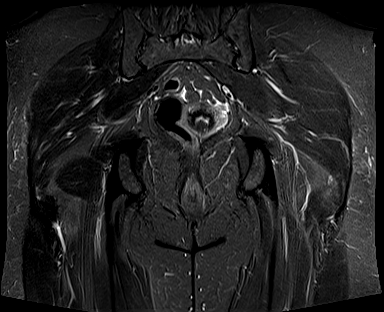
[im 14/38]
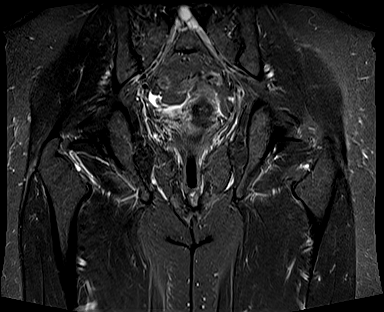
[im 19/38]
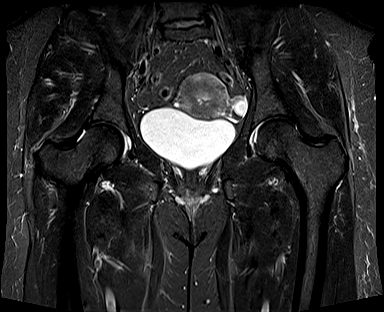
[im 24/38]
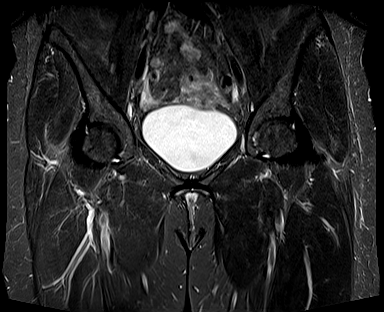
[im 28/38]
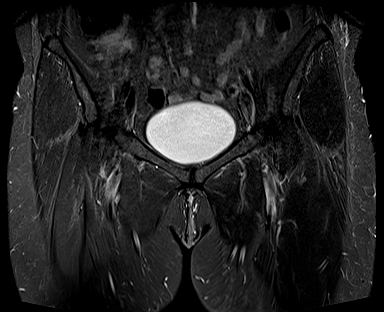
[im 33/38]
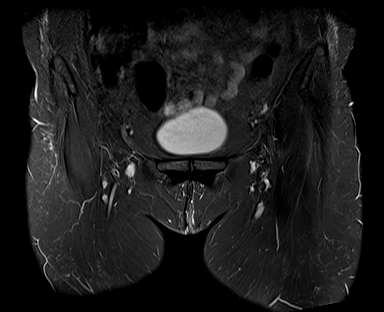
[im 38/38]
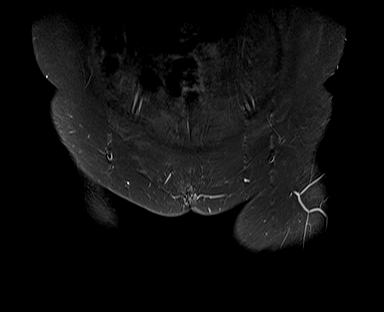

[Series 14: T2 fat-sat · axial · 4.0mm · 1.14mm/px · z∈[-45,+100]mm · 7 of 30 slices shown]
[im 1/30]
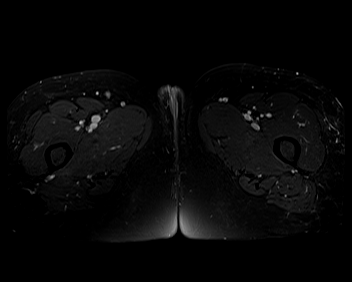
[im 5/30]
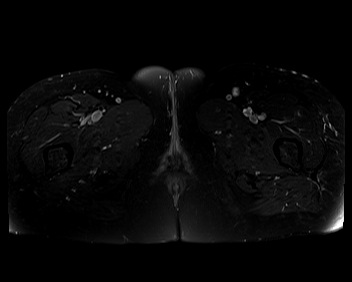
[im 10/30]
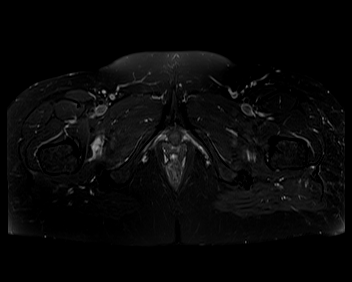
[im 15/30]
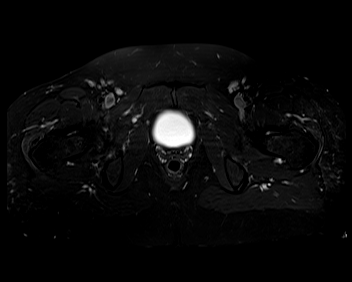
[im 20/30]
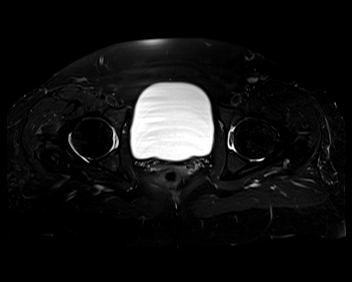
[im 25/30]
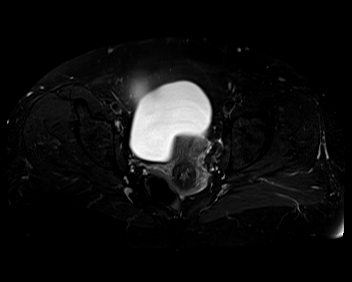
[im 30/30]
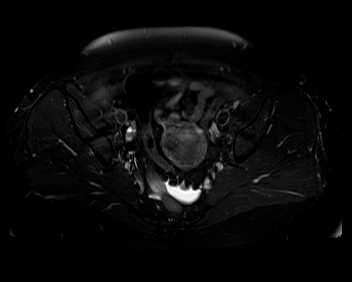

[Series 15: PD fat-sat · axial · 4.0mm · 0.62mm/px · z∈[-53,+107]mm · 8 of 33 slices shown (1 of 3)]
[im 1/33]
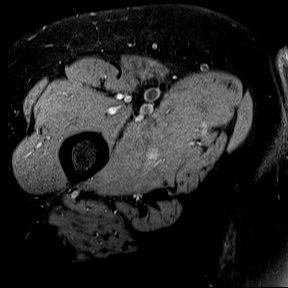
[im 5/33]
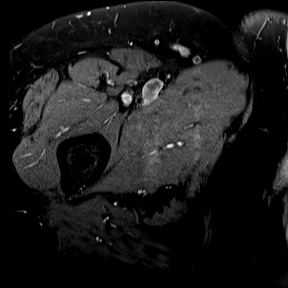
[im 10/33]
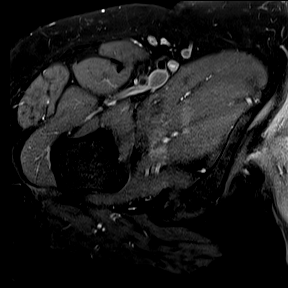
[im 14/33]
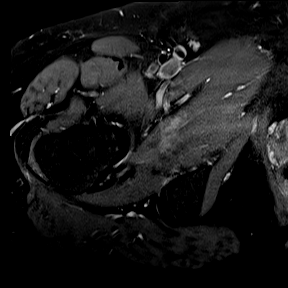
[im 19/33]
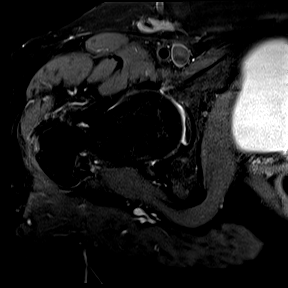
[im 23/33]
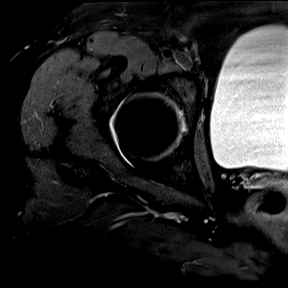
[im 28/33]
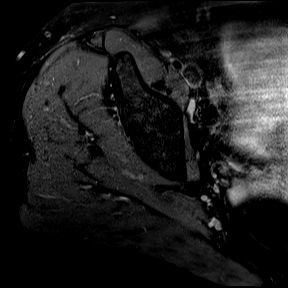
[im 33/33]
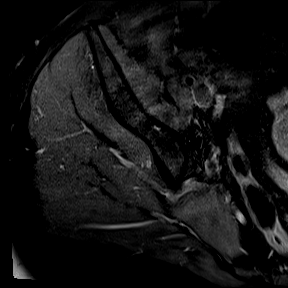

[Series 16: PD fat-sat · sagittal · 4.0mm · 0.70mm/px · 8 of 31 slices shown (2 of 3)]
[im 1/31]
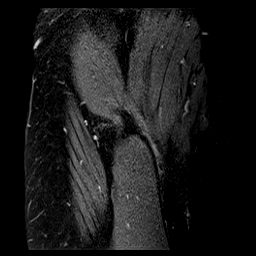
[im 5/31]
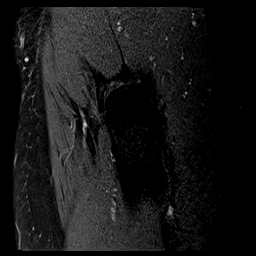
[im 9/31]
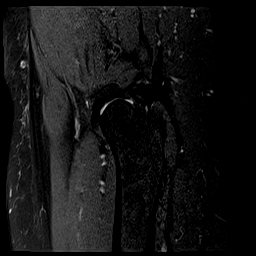
[im 13/31]
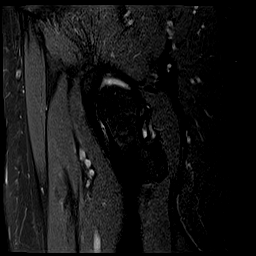
[im 18/31]
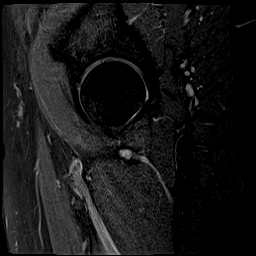
[im 22/31]
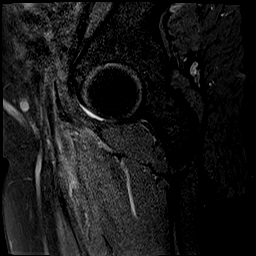
[im 26/31]
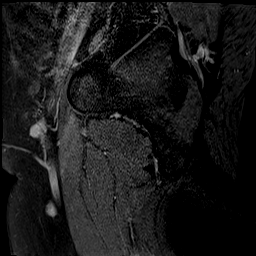
[im 31/31]
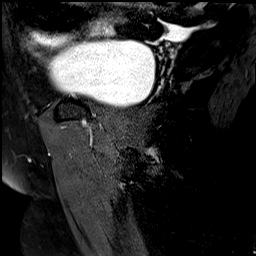

[Series 17: PD fat-sat · coronal · 4.0mm · 0.70mm/px · 1 of 24 slices shown (3 of 3)]
[im 1/24]
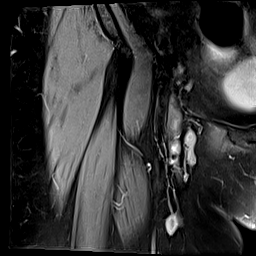

[41 of 48 positions shown; findings below may reference images not displayed]

FINDINGS: Bones: No acute fracture. No dislocation. No femoral head avascular
necrosis. Bony pelvis is intact. SI joints and pubic symphysis
within normal limits. No bone marrow edema. No suspicious bone
lesion.

Articular cartilage and labrum

Articular cartilage: No cartilage defect. No subchondral marrow
signal changes.

Labrum:  No evidence of labral tear.  No paralabral cyst.

Joint or bursal effusion

Joint effusion:  None.

Bursae: No abnormal bursal fluid collection.

Muscles and tendons

Muscles and tendons: Mild tendinosis of the right gluteus minimus
tendon with edema at the myotendinous junction. The gluteus medius,
hamstring, iliopsoas, rectus femoris, and adductor tendons appear
intact without tear or significant tendinosis. Preserved muscle
bulk.

Other findings

Miscellaneous: No soft tissue fluid collection or hematoma. No
inguinal lymphadenopathy. Small volume free fluid within the pelvis,
which may be physiologic.
IMPRESSION: 1. Mild tendinosis of the right gluteus minimus tendon with edema at
the myotendinous junction, which may reflect low-grade strain.
2. Otherwise unremarkable MRI of the right hip.

## 2022-03-10 ENCOUNTER — Emergency Department (HOSPITAL_BASED_OUTPATIENT_CLINIC_OR_DEPARTMENT_OTHER)
Admission: EM | Admit: 2022-03-10 | Discharge: 2022-03-11 | Disposition: A | Discharge status: Home / Self Care | Payer: Managed Care, Other (non HMO) | Attending: Emergency Medicine | Admitting: Emergency Medicine

## 2022-03-10 ENCOUNTER — Encounter (HOSPITAL_BASED_OUTPATIENT_CLINIC_OR_DEPARTMENT_OTHER): Payer: Self-pay | Admitting: Emergency Medicine

## 2022-03-10 ENCOUNTER — Emergency Department (HOSPITAL_COMMUNITY): Payer: Managed Care, Other (non HMO)

## 2022-03-10 ENCOUNTER — Other Ambulatory Visit: Payer: Self-pay

## 2022-03-10 DIAGNOSIS — M25551 Pain in right hip: Secondary | ICD-10-CM | POA: Diagnosis not present

## 2022-03-10 DIAGNOSIS — M5410 Radiculopathy, site unspecified: Secondary | ICD-10-CM | POA: Diagnosis not present

## 2022-03-10 LAB — PREGNANCY, URINE: Preg Test, Ur: NEGATIVE

## 2022-03-10 MED ORDER — DEXAMETHASONE SODIUM PHOSPHATE 10 MG/ML IJ SOLN
10.0000 mg | Freq: Once | INTRAMUSCULAR | Status: AC
  Administered 2022-03-10: 10 mg via INTRAMUSCULAR
  Filled 2022-03-10: qty 1

## 2022-03-10 MED ORDER — LORAZEPAM 2 MG/ML IJ SOLN: 1.0000 mg | Freq: Once | INTRAMUSCULAR | Status: DC | PRN

## 2022-03-10 MED ORDER — KETOROLAC TROMETHAMINE 60 MG/2ML IM SOLN
60.0000 mg | Freq: Once | INTRAMUSCULAR | Status: AC
  Administered 2022-03-10: 60 mg via INTRAMUSCULAR
  Filled 2022-03-10: qty 2

## 2022-03-10 MED ORDER — LIDOCAINE 5 % EX PTCH
1.0000 | MEDICATED_PATCH | CUTANEOUS | Status: DC
  Administered 2022-03-10: 1 via TRANSDERMAL
  Filled 2022-03-10: qty 1

## 2022-03-10 MED ORDER — ACETAMINOPHEN 500 MG PO TABS
1000.0000 mg | ORAL_TABLET | Freq: Once | ORAL | Status: AC
  Administered 2022-03-10: 1000 mg via ORAL
  Filled 2022-03-10: qty 2

## 2022-03-10 MED ORDER — HYDROMORPHONE HCL 1 MG/ML IJ SOLN
1.0000 mg | Freq: Once | INTRAMUSCULAR | Status: AC
  Administered 2022-03-10: 1 mg via INTRAMUSCULAR
  Filled 2022-03-10: qty 1

## 2022-03-10 NOTE — Progress Notes (Signed)
RT note: Vital signs updated, RN aware.

## 2022-03-10 NOTE — ED Triage Notes (Signed)
Pt here from home with c/o right hip and right leg pain and right knee , no trauma noted , hx of same ,

## 2022-03-10 NOTE — ED Notes (Signed)
Report given to Charge at [facility] ED.

## 2022-03-10 NOTE — ED Provider Notes (Signed)
[facility] EMERGENCY DEPT Provider Note   CSN: [id number] Arrival date & time: 03/10/22  1601     History {Add pertinent medical, surgical, social history, OB history to HPI:1} No chief complaint on file.   Emma Johnson is a 46 y.o. female.  HPI    46 year old female with a history of complex regional pain syndrome of the right lower extremity      She has a history of previous microdiscectomy at L4-L5 with chronic right leg pain ever since then.  When she was seen in June they had described last transitioning her from gabapentin 600 mg 4 times a day to Lyrica 150 mg 3 times a day and she was continued to have a lot of neuropathic pain she had previously trialed transforaminal injections as well as sympathetic block without benefit she also had trial of medication such as amitriptyline when she first transition to Lyrica she had significant swelling in her lower extremities for 2 weeks she gradually decrease medication and began to increase it to 3 times a day and felt that it had worked well for her.  She also began meloxicam 15 mg once a day which was helpful.  She had stated she typically gets pain through her right hip and right thigh as well as numbness in her right leg and weather changes make her symptoms worse  Past Medical History:  Diagnosis Date  . AMA (advanced maternal age) multigravida 69+   . Depression   . Fibroids   . No pertinent past medical history      Home Medications Prior to Admission medications   Medication Sig Start Date End Date Taking? Authorizing Provider  HYDROcodone-acetaminophen (NORCO) 5-325 MG tablet Take 1 tablet by mouth 3 (three) times daily as needed for severe pain. 10/17/20   Erven Colla, DO  hydrocortisone (ANUSOL-HC) 25 MG suppository Place 1 suppository (25 mg total) rectally 2 (two) times daily. 10/17/20   Lovena Le, Malena M, DO  ibuprofen (ADVIL) 800 MG tablet Take 1 tablet (800 mg total) by mouth every 8  (eight) hours as needed. 09/18/20   Erven Colla, DO  methocarbamol (ROBAXIN) 500 MG tablet Take 1 tablet (500 mg total) by mouth every 8 (eight) hours as needed for muscle spasms. 10/10/20   Hayden Rasmussen, MD  norethindrone [facility]) 0.35 MG tablet Take 1 tablet by mouth once daily 07/10/21   Florian Buff, MD  nystatin (MYCOSTATIN) 100000 UNIT/ML suspension Take 5 mLs (500,000 Units total) by mouth 4 (four) times daily. 10/23/20   Lovena Le, Malena M, DO  pantoprazole (PROTONIX) 40 MG tablet Take 1 tablet (40 mg total) by mouth daily. 09/18/20   Lovena Le, Malena M, DO  predniSONE (DELTASONE) 20 MG tablet Take 3 tab p.o. x 2 days, then 2 tab x 2 days, then 1 tab x 2 days.  Take with food. 10/01/20   Avegno, Darrelyn Hillock, FNP      Allergies    Bee venom and Onion    Review of Systems   Review of Systems  Physical Exam Updated Vital Signs BP (!) 156/72   Pulse 80   Temp 98 F (36.7 C) (Oral)   Resp 18   Ht '5\' 10"'$  (1.778 m)   Wt 92.1 kg   SpO2 100%   BMI 29.13 kg/m  Physical Exam  ED Results / Procedures / Treatments   Labs (all labs ordered are listed, but only abnormal results are displayed) Labs Reviewed - No data to display  EKG None  Radiology No results found.  Procedures Procedures  {Document cardiac monitor, telemetry assessment procedure when appropriate:1}  Medications Ordered in ED Medications - No data to display  ED Course/ Medical Decision Making/ A&P                           Medical Decision Making  ***  {Document critical care time when appropriate:1} {Document review of labs and clinical decision tools ie heart score, Chads2Vasc2 etc:1}  {Document your independent review of radiology images, and any outside records:1} {Document your discussion with family members, caretakers, and with consultants:1} {Document social determinants of health affecting pt's care:1} {Document your decision making why or why not admission, treatments were  needed:1} Final Clinical Impression(s) / ED Diagnoses Final diagnoses:  None    Rx / DC Orders ED Discharge Orders     None

## 2022-03-11 NOTE — ED Notes (Signed)
Pt ambulatory.

## 2022-03-11 NOTE — ED Provider Notes (Signed)
Patient transferred from [facility] for evaluation of MRI lumbar spine and hip for progressive right lower extremity pain and numbness.  MRI with chronic changes of the lumbar spine, no acute pathology to explain exacerbation of patient's pain.  MRI hip with preliminary report without acute abnormality.  Discussed with patient unclear source of pain, recommend PCP, neurosurgery follow-up and return precautions.   Quintella Reichert, MD 03/11/22 [phone]

## 2022-03-11 NOTE — Discharge Instructions (Addendum)
The cause of your worsening pain was not identified.  Please continue your current medications.  Please follow up with your family doctor and neurosurgeon for further evaluation.

## 2022-03-17 ENCOUNTER — Observation Stay (HOSPITAL_COMMUNITY)
Admission: EM | Admit: 2022-03-17 | Discharge: 2022-03-20 | Disposition: A | Discharge status: Home / Self Care | Payer: Managed Care, Other (non HMO) | Attending: Neurological Surgery | Admitting: Neurological Surgery

## 2022-03-17 ENCOUNTER — Other Ambulatory Visit: Payer: Self-pay

## 2022-03-17 ENCOUNTER — Encounter (HOSPITAL_COMMUNITY): Payer: Self-pay | Admitting: *Deleted

## 2022-03-17 DIAGNOSIS — F32A Depression, unspecified: Secondary | ICD-10-CM | POA: Diagnosis present

## 2022-03-17 DIAGNOSIS — K219 Gastro-esophageal reflux disease without esophagitis: Secondary | ICD-10-CM | POA: Insufficient documentation

## 2022-03-17 DIAGNOSIS — M5116 Intervertebral disc disorders with radiculopathy, lumbar region: Secondary | ICD-10-CM | POA: Diagnosis not present

## 2022-03-17 DIAGNOSIS — F17210 Nicotine dependence, cigarettes, uncomplicated: Secondary | ICD-10-CM | POA: Diagnosis not present

## 2022-03-17 DIAGNOSIS — M5416 Radiculopathy, lumbar region: Secondary | ICD-10-CM | POA: Diagnosis present

## 2022-03-17 DIAGNOSIS — Z91018 Allergy to other foods: Secondary | ICD-10-CM

## 2022-03-17 DIAGNOSIS — Z91030 Bee allergy status: Secondary | ICD-10-CM

## 2022-03-17 DIAGNOSIS — M5126 Other intervertebral disc displacement, lumbar region: Principal | ICD-10-CM

## 2022-03-17 DIAGNOSIS — Z79899 Other long term (current) drug therapy: Secondary | ICD-10-CM

## 2022-03-17 LAB — BASIC METABOLIC PANEL
Anion gap: 2 — ABNORMAL LOW (ref 5–15)
BUN: 17 mg/dL (ref 6–20)
CO2: 27 mmol/L (ref 22–32)
Calcium: 8.7 mg/dL — ABNORMAL LOW (ref 8.9–10.3)
Chloride: 109 mmol/L (ref 98–111)
Creatinine, Ser: 0.74 mg/dL (ref 0.44–1.00)
GFR, Estimated: >60 mL/min (ref >60)
Glucose, Bld: 111 mg/dL — ABNORMAL HIGH (ref 70–99)
Potassium: 3.8 mmol/L (ref 3.5–5.1)
Sodium: 138 mmol/L (ref 135–145)

## 2022-03-17 LAB — CBC WITH DIFFERENTIAL/PLATELET
Abs Immature Granulocytes: 0.07 10*3/uL (ref 0.00–0.07)
Basophils Absolute: 0.1 10*3/uL (ref 0.0–0.1)
Basophils Relative: 0 %
Eosinophils Absolute: 0 10*3/uL (ref 0.0–0.5)
Eosinophils Relative: 0 %
HCT: 45.4 % (ref 36.0–46.0)
Hemoglobin: 14.9 g/dL (ref 12.0–15.0)
Immature Granulocytes: 1 %
Lymphocytes Relative: 13 %
Lymphs Abs: 1.7 10*3/uL (ref 0.7–4.0)
MCH: 29.7 pg (ref 26.0–34.0)
MCHC: 32.8 g/dL (ref 30.0–36.0)
MCV: 90.4 fL (ref 80.0–100.0)
Monocytes Absolute: 0.8 10*3/uL (ref 0.1–1.0)
Monocytes Relative: 6 %
Neutro Abs: 10.8 10*3/uL — ABNORMAL HIGH (ref 1.7–7.7)
Neutrophils Relative %: 80 %
Platelets: 188 10*3/uL (ref 150–400)
RBC: 5.02 MIL/uL (ref 3.87–5.11)
RDW: 14.6 % (ref 11.5–15.5)
Smear Review: NORMAL
WBC: 13.4 10*3/uL — ABNORMAL HIGH (ref 4.0–10.5)
nRBC: 0 % (ref 0.0–0.2)

## 2022-03-17 LAB — PREGNANCY, URINE: Preg Test, Ur: NEGATIVE

## 2022-03-17 MED ORDER — OXYCODONE HCL 5 MG PO TABS: 10.0000 mg | ORAL_TABLET | ORAL | Status: DC | PRN

## 2022-03-17 MED ORDER — SODIUM CHLORIDE 0.9 % IV SOLN
250.0000 mL | INTRAVENOUS | Status: DC
  Administered 2022-03-17: 250 mL via INTRAVENOUS

## 2022-03-17 MED ORDER — ONDANSETRON HCL 4 MG/2ML IJ SOLN: 4.0000 mg | Freq: Four times a day (QID) | INTRAMUSCULAR | Status: DC | PRN

## 2022-03-17 MED ORDER — HYDROMORPHONE HCL 1 MG/ML IJ SOLN
1.0000 mg | INTRAMUSCULAR | Status: DC | PRN
  Administered 2022-03-17 – 2022-03-19 (×11): 1 mg via INTRAVENOUS
  Filled 2022-03-17 (×11): qty 1

## 2022-03-17 MED ORDER — DOCUSATE SODIUM 100 MG PO CAPS
100.0000 mg | ORAL_CAPSULE | Freq: Two times a day (BID) | ORAL | Status: DC
  Administered 2022-03-17 – 2022-03-20 (×5): 100 mg via ORAL
  Filled 2022-03-17 (×6): qty 1

## 2022-03-17 MED ORDER — ACETAMINOPHEN 325 MG PO TABS: 650.0000 mg | ORAL_TABLET | ORAL | Status: DC | PRN

## 2022-03-17 MED ORDER — OXYCODONE HCL 5 MG PO TABS
5.0000 mg | ORAL_TABLET | ORAL | Status: DC | PRN
  Filled 2022-03-17: qty 1

## 2022-03-17 MED ORDER — POLYETHYLENE GLYCOL 3350 17 G PO PACK
17.0000 g | PACK | Freq: Every day | ORAL | Status: DC | PRN
  Filled 2022-03-17: qty 1

## 2022-03-17 MED ORDER — HYDROMORPHONE HCL 1 MG/ML IJ SOLN
2.0000 mg | Freq: Once | INTRAMUSCULAR | Status: AC
  Administered 2022-03-17: 2 mg via INTRAVENOUS
  Filled 2022-03-17: qty 2

## 2022-03-17 MED ORDER — PHENOL 1.4 % MT LIQD: 1.0000 | OROMUCOSAL | Status: DC | PRN

## 2022-03-17 MED ORDER — HYDROMORPHONE HCL 1 MG/ML IJ SOLN: 1.0000 mg | Freq: Once | INTRAMUSCULAR | Status: DC

## 2022-03-17 MED ORDER — SODIUM CHLORIDE 0.9% FLUSH
3.0000 mL | INTRAVENOUS | Status: DC | PRN
  Administered 2022-03-17: 3 mL via INTRAVENOUS

## 2022-03-17 MED ORDER — ONDANSETRON HCL 4 MG PO TABS: 4.0000 mg | ORAL_TABLET | Freq: Four times a day (QID) | ORAL | Status: DC | PRN

## 2022-03-17 MED ORDER — SODIUM CHLORIDE 0.9% FLUSH
3.0000 mL | Freq: Two times a day (BID) | INTRAVENOUS | Status: DC
  Administered 2022-03-17 – 2022-03-20 (×5): 3 mL via INTRAVENOUS

## 2022-03-17 MED ORDER — ACETAMINOPHEN 650 MG RE SUPP: 650.0000 mg | RECTAL | Status: DC | PRN

## 2022-03-17 MED ORDER — CEFAZOLIN SODIUM-DEXTROSE 2-4 GM/100ML-% IV SOLN
2.0000 g | Freq: Three times a day (TID) | INTRAVENOUS | Status: AC
  Administered 2022-03-17 – 2022-03-18 (×2): 2 g via INTRAVENOUS
  Filled 2022-03-17 (×2): qty 100

## 2022-03-17 MED ORDER — CYCLOBENZAPRINE HCL 10 MG PO TABS
10.0000 mg | ORAL_TABLET | Freq: Three times a day (TID) | ORAL | Status: DC | PRN
  Administered 2022-03-19: 10 mg via ORAL
  Filled 2022-03-17: qty 1

## 2022-03-17 MED ORDER — CEFAZOLIN SODIUM-DEXTROSE 2-4 GM/100ML-% IV SOLN: 2.0000 g | Freq: Three times a day (TID) | INTRAVENOUS | Status: DC

## 2022-03-17 MED ORDER — MENTHOL 3 MG MT LOZG
1.0000 | LOZENGE | OROMUCOSAL | Status: DC | PRN
Start: 2022-03-17 — End: 2022-03-20

## 2022-03-17 NOTE — ED Triage Notes (Addendum)
Pt brought in by [facility] from home with c/o right hip pain x 3 weeks. Pt was seen at [facility] for same last Monday and had MRI performed. Unable to see her neurosurgeon until mid August. Hx back surgery March of 2022. Pt's neurosurgeon called in prescription for Prednisone and has been taking it since Friday. Pt c/o numbness to entire right leg. Pt reports she is unable to ambulate x 2 days. EMS Vitals - BP 176/85, HR 74, O2 sat 97% RA, resp 18.

## 2022-03-17 NOTE — ED Provider Notes (Signed)
[facility] EMERGENCY DEPARTMENT Provider Note   CSN: [id number] Arrival date & time: 03/17/22  1745     History  Chief Complaint  Patient presents with   Hip Pain    Emma Johnson is a 46 y.o. female.  HPI     46 year old female comes in a chief complaint of hip pain and back pain.  Patient is unable to walk because of her pain.  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 Ronnald Ramp in 2022.  Patient states that last September, she had an episode of severe pain that was disabling and she was unable to walk.  She was diagnosed with complex regional pain syndrome.  She has been seeing her pain specialist for pain control.  Normally she is quite active, raising 2 kids and working and going to school.  However over the last 3 weeks, she has started having worsening pain and it has disabled her.  Now she is having difficulty walking because of severe pain, and she soils herself in the bed because of significant pain.  She was seen in the ER last week.  She had MRI done.  She was given Medrol Dosepak -but despite the steroids, pain medicine her symptoms have not improved.  Patient's partner is at the bedside.  States that patient has not been able to walk over the last 24 hours.  Patient denies any new urinary incontinence, urinary retention, saddle anesthesia.  She states that the pain is in her back and moves to the right side.  She has associated numbness and tingling sometimes.  She does not think she is weak, she thinks that she is unable to walk because of pain.  Home Medications Prior to Admission medications   Medication Sig Start Date End Date Taking? Authorizing Provider  HYDROcodone-acetaminophen (NORCO) 5-325 MG tablet Take 1 tablet by mouth 3 (three) times daily as needed for severe pain. 10/17/20   Erven Colla, DO  hydrocortisone (ANUSOL-HC) 25 MG suppository Place 1 suppository (25 mg total) rectally 2 (two) times  daily. 10/17/20   Lovena Le, Malena M, DO  ibuprofen (ADVIL) 800 MG tablet Take 1 tablet (800 mg total) by mouth every 8 (eight) hours as needed. 09/18/20   Erven Colla, DO  methocarbamol (ROBAXIN) 500 MG tablet Take 1 tablet (500 mg total) by mouth every 8 (eight) hours as needed for muscle spasms. 10/10/20   Hayden Rasmussen, MD  norethindrone [facility]) 0.35 MG tablet Take 1 tablet by mouth once daily 07/10/21   Florian Buff, MD  nystatin (MYCOSTATIN) 100000 UNIT/ML suspension Take 5 mLs (500,000 Units total) by mouth 4 (four) times daily. 10/23/20   Lovena Le, Malena M, DO  pantoprazole (PROTONIX) 40 MG tablet Take 1 tablet (40 mg total) by mouth daily. 09/18/20   Lovena Le, Malena M, DO  predniSONE (DELTASONE) 20 MG tablet Take 3 tab p.o. x 2 days, then 2 tab x 2 days, then 1 tab x 2 days.  Take with food. 10/01/20   Avegno, Darrelyn Hillock, FNP      Allergies    Bee venom and Onion    Review of Systems   Review of Systems  All other systems reviewed and are negative.   Physical Exam Updated Vital Signs BP 122/63   Pulse 63   Ht '5\' 10"'$  (1.778 m)   Wt 92.1 kg   SpO2 94%   BMI 29.13 kg/m  Physical Exam Vitals and nursing note  reviewed.  Constitutional:      Appearance: She is well-developed.  HENT:     Head: Atraumatic.  Cardiovascular:     Rate and Rhythm: Normal rate.  Pulmonary:     Effort: Pulmonary effort is normal.  Musculoskeletal:     Cervical back: Normal range of motion and neck supple.  Skin:    General: Skin is warm and dry.  Neurological:     Mental Status: She is alert and oriented to person, place, and time.     Sensory: No sensory deficit.     Comments: Patient unable to complete active right leg raise. Patient had worsening pain with passive leg raise of the right side     ED Results / Procedures / Treatments   Labs (all labs ordered are listed, but only abnormal results are displayed) Labs Reviewed  CBC WITH DIFFERENTIAL/PLATELET - Abnormal; Notable for the  following components:      Result Value   WBC 13.4 (*)    Neutro Abs 10.8 (*)    All other components within normal limits  BASIC METABOLIC PANEL - Abnormal; Notable for the following components:   Glucose, Bld 111 (*)    Calcium 8.7 (*)    Anion gap 2 (*)    All other components within normal limits  PREGNANCY, URINE    EKG None  Radiology No results found.  Procedures Procedures    Medications Ordered in ED Medications  sodium chloride flush (NS) 0.9 % injection 3 mL (has no administration in time range)  sodium chloride flush (NS) 0.9 % injection 3 mL (has no administration in time range)  0.9 %  sodium chloride infusion (250 mLs Intravenous New Bag/Given 03/17/22 2000)  acetaminophen (TYLENOL) tablet 650 mg (has no administration in time range)    Or  acetaminophen (TYLENOL) suppository 650 mg (has no administration in time range)  oxyCODONE (Oxy IR/ROXICODONE) immediate release tablet 5 mg (has no administration in time range)  oxyCODONE (Oxy IR/ROXICODONE) immediate release tablet 10 mg (has no administration in time range)  HYDROmorphone (DILAUDID) injection 1 mg (has no administration in time range)  cyclobenzaprine (FLEXERIL) tablet 10 mg (has no administration in time range)  docusate sodium (COLACE) capsule 100 mg (has no administration in time range)  polyethylene glycol (MIRALAX / GLYCOLAX) packet 17 g (has no administration in time range)  ondansetron (ZOFRAN) tablet 4 mg (has no administration in time range)    Or  ondansetron (ZOFRAN) injection 4 mg (has no administration in time range)  menthol-cetylpyridinium (CEPACOL) lozenge 3 mg (has no administration in time range)    Or  phenol (CHLORASEPTIC) mouth spray 1 spray (has no administration in time range)  ceFAZolin (ANCEF) IVPB 2g/100 mL premix (2 g Intravenous New Bag/Given 03/17/22 2000)  HYDROmorphone (DILAUDID) injection 2 mg (2 mg Intravenous Given 03/17/22 1915)    ED Course/ Medical Decision Making/  A&P                           Medical Decision Making Amount and/or Complexity of Data Reviewed Labs: ordered.  Risk Prescription drug management. Decision regarding hospitalization.   46 year old female comes in a chief complaint of back pain and right leg pain. She has history of degenerative spine disease status post surgical repair last year.  She states that she ended up with some nerve injury because of the surgery and now has complex regional pain syndrome.  She was quite functional, until the  last 2 or 3 weeks.  Now she has profound pain that makes it difficult for her to get around.  Pain medicine and steroids have not given relief.  I reviewed patient's previous surgical note.  I also reviewed patient's MRI from 2022 and then again from 1 week ago.  History provided by patient's partner as well.  MRI from last week revealed did reveal points of herniation that could be causing some nerve impingement.  Differential diagnosis essentially is herniated disc causing radicular symptoms.  It appears that the pain is refractory to conservative measures.  Other possibility includes complex regional pain syndrome as the primary cause. No red flags for cauda equina or myelopathy or spinal cord syndrome.  I discussed case with Dr. Venetia Constable, neurosurgery, given the severity of the symptoms.  Dr. Venetia Constable has reviewed images and recommends admission to [facility] if patient would want to consider surgical intervention.  Discussed recommendations from neurosurgery with the patient and her partner.  Patient states that at this point she would want to try surgery if that is going to help her walk and have better pain control.  Dr. Venetia Constable will admit.  Final Clinical Impression(s) / ED Diagnoses Final diagnoses:  Lumbar herniated disc    Rx / DC Orders ED Discharge Orders     None         Varney Biles, MD 03/17/22 2044

## 2022-03-17 NOTE — Progress Notes (Signed)
Incentive Spirometer given.  Instructions on frequency relayed to patient.  Patient was able to achieve 2057m X5, but wanted to stop.  Patient gave good effort and IS left with patient.

## 2022-03-18 DIAGNOSIS — M5116 Intervertebral disc disorders with radiculopathy, lumbar region: Secondary | ICD-10-CM | POA: Diagnosis present

## 2022-03-18 DIAGNOSIS — F32A Depression, unspecified: Secondary | ICD-10-CM | POA: Diagnosis present

## 2022-03-18 DIAGNOSIS — K219 Gastro-esophageal reflux disease without esophagitis: Secondary | ICD-10-CM | POA: Diagnosis not present

## 2022-03-18 DIAGNOSIS — F17210 Nicotine dependence, cigarettes, uncomplicated: Secondary | ICD-10-CM | POA: Diagnosis not present

## 2022-03-18 DIAGNOSIS — Z91018 Allergy to other foods: Secondary | ICD-10-CM | POA: Diagnosis not present

## 2022-03-18 DIAGNOSIS — M5416 Radiculopathy, lumbar region: Secondary | ICD-10-CM | POA: Diagnosis present

## 2022-03-18 DIAGNOSIS — Z91030 Bee allergy status: Secondary | ICD-10-CM | POA: Diagnosis not present

## 2022-03-18 DIAGNOSIS — Z79899 Other long term (current) drug therapy: Secondary | ICD-10-CM | POA: Diagnosis not present

## 2022-03-18 LAB — SURGICAL PCR SCREEN
MRSA, PCR: NEGATIVE
Staphylococcus aureus: NEGATIVE

## 2022-03-18 MED ORDER — CEFAZOLIN SODIUM-DEXTROSE 2-4 GM/100ML-% IV SOLN
2.0000 g | INTRAVENOUS | Status: AC
  Administered 2022-03-19: 2 g via INTRAVENOUS
  Filled 2022-03-18: qty 100

## 2022-03-18 NOTE — H&P (Signed)
Neurosurgery H&P  CC: Right leg pain  HPI: This is a 46 y.o. woman that presents with low back and right lower extremity pain. She is a patient of Dr. Ronnald Ramp, he previously performed a far lateral discectomy in 2022. She is a difficult historian, but from what I can gather she actually did quite well initially post-op and has had some possible CRPS type symptoms in the RLE. More recently, for the past few weeks she has had progressive RLE pain that, for the past week, has made her non-ambulatory and only able to lay in bed due to the pain. No new weakness, +numbness and paresthesias in the R thigh, no recent change in bowel or bladder function. No recent use of anti-platelet or anti-coagulant medications. These symptoms feel different than her baseline RLE pain and are quite severe.    ROS: A 14 point ROS was performed and is negative except as noted in the HPI.   PMHx:  Past Medical History:  Diagnosis Date   AMA (advanced maternal age) multigravida 35+    Depression    Fibroids    No pertinent past medical history    FamHx:  Family History  Problem Relation Age of Onset   Hypertension Father    Multiple sclerosis Mother    Cancer Maternal Aunt        uterine and lung   Cancer Paternal Uncle        leukemia   Cancer Maternal Grandmother        breast   Heart disease Paternal Grandmother    SocHx:  reports that she has been smoking cigarettes. She has a 20.00 pack-year smoking history. She has never used smokeless tobacco. She reports that she does not drink alcohol and does not use drugs.  Exam: Vital signs in last 24 hours: Temp:  [98.1 F (36.7 C)-98.4 F (36.9 C)] 98.1 F (36.7 C) (08/08 1123) Pulse Rate:  [56-72] 61 (08/08 1123) Resp:  [16-19] 16 (08/08 1123) BP: (101-135)/(45-70) 110/55 (08/08 1123) SpO2:  [92 %-99 %] 95 % (08/08 1123) Weight:  [92.1 kg] 92.1 kg (08/07 1751) General: Awake, alert, cooperative, lying in bed, appears uncomfortable Head: Normocephalic  and atruamatic HEENT: Neck supple Pulmonary: breathing room air comfortably, no evidence of increased work of breathing Cardiac: RRR Abdomen: S NT ND Extremities: Warm and well perfused x4, neg FABER, +SLR on R, neg on L Neuro: AOx3, PERRL, EOMI, FS Strength 5/5 x4 except pain limited in the proximal R leg, SILTx4 except for R anterior thigh numbness most c/w proximal L3 versus L2 dermatome   Assessment and Plan: 46 y.o. woman with progressive RLE and low back pain. MRI personally reviewed, which shows recurrence of R L3-4 far lateral disc herniation, now appears similar to how it was prior to her discectomy last year.   -had a long discussion with the patient, complex situation with her atypical / CRPS symptoms. She feels like this pain is similar to what she had preop and improved with discectomy last year. Given that she's non-ambulatory without any significant improvement in pain control despite multi-modality therapy, we discussed a repeat discectomy. I was quite frank that her benefits are uncertain in this scenario, but it's the only pathology I see that would explain her symptoms. She's miserable, states she can't live like this, and would like to proceed. OR tomorrow for rpt discectomy.  -NPO p MN   Judith Part, MD 03/18/22 11:42 AM [facility]

## 2022-03-19 ENCOUNTER — Other Ambulatory Visit: Payer: Self-pay

## 2022-03-19 ENCOUNTER — Inpatient Hospital Stay (HOSPITAL_COMMUNITY): Payer: Managed Care, Other (non HMO) | Admitting: Anesthesiology

## 2022-03-19 ENCOUNTER — Encounter (HOSPITAL_COMMUNITY)
Admission: EM | Disposition: A | Discharge status: Home / Self Care | Payer: Self-pay | Source: Home / Self Care | Attending: Emergency Medicine

## 2022-03-19 ENCOUNTER — Inpatient Hospital Stay (HOSPITAL_COMMUNITY): Payer: Managed Care, Other (non HMO)

## 2022-03-19 DIAGNOSIS — M5416 Radiculopathy, lumbar region: Secondary | ICD-10-CM

## 2022-03-19 HISTORY — PX: LUMBAR LAMINECTOMY/ DECOMPRESSION WITH MET-RX: SHX5959

## 2022-03-19 SURGERY — LUMBAR LAMINECTOMY/ DECOMPRESSION WITH MET-RX
Anesthesia: General

## 2022-03-19 MED ORDER — MIDAZOLAM HCL 2 MG/2ML IJ SOLN
INTRAMUSCULAR | Status: DC | PRN
  Administered 2022-03-19: 2 mg via INTRAVENOUS

## 2022-03-19 MED ORDER — PROPOFOL 10 MG/ML IV BOLUS
INTRAVENOUS | Status: DC | PRN
  Administered 2022-03-19: 160 mg via INTRAVENOUS

## 2022-03-19 MED ORDER — DEXAMETHASONE SODIUM PHOSPHATE 10 MG/ML IJ SOLN
INTRAMUSCULAR | Status: AC
  Filled 2022-03-19: qty 1

## 2022-03-19 MED ORDER — FENTANYL CITRATE (PF) 250 MCG/5ML IJ SOLN
INTRAMUSCULAR | Status: DC | PRN
  Administered 2022-03-19: 100 ug via INTRAVENOUS
  Administered 2022-03-19 (×3): 50 ug via INTRAVENOUS

## 2022-03-19 MED ORDER — FENTANYL CITRATE (PF) 250 MCG/5ML IJ SOLN
INTRAMUSCULAR | Status: AC
  Filled 2022-03-19: qty 5

## 2022-03-19 MED ORDER — 0.9 % SODIUM CHLORIDE (POUR BTL) OPTIME
TOPICAL | Status: DC | PRN
  Administered 2022-03-19: 1000 mL

## 2022-03-19 MED ORDER — OXYCODONE HCL 5 MG PO TABS: 5.0000 mg | ORAL_TABLET | Freq: Once | ORAL | Status: DC | PRN

## 2022-03-19 MED ORDER — PROMETHAZINE HCL 25 MG/ML IJ SOLN: 6.2500 mg | INTRAMUSCULAR | Status: DC | PRN

## 2022-03-19 MED ORDER — KETAMINE HCL 50 MG/5ML IJ SOSY
PREFILLED_SYRINGE | INTRAMUSCULAR | Status: AC
  Filled 2022-03-19: qty 5

## 2022-03-19 MED ORDER — HYDROMORPHONE HCL 1 MG/ML IJ SOLN
0.2500 mg | INTRAMUSCULAR | Status: DC | PRN
  Administered 2022-03-19 (×2): 0.5 mg via INTRAVENOUS

## 2022-03-19 MED ORDER — THROMBIN 5000 UNITS EX SOLR
CUTANEOUS | Status: AC
  Filled 2022-03-19: qty 5000

## 2022-03-19 MED ORDER — HYDROCODONE-ACETAMINOPHEN 5-325 MG PO TABS
1.0000 | ORAL_TABLET | ORAL | Status: DC | PRN
  Administered 2022-03-19: 1 via ORAL
  Filled 2022-03-19: qty 2

## 2022-03-19 MED ORDER — PHENYLEPHRINE 80 MCG/ML (10ML) SYRINGE FOR IV PUSH (FOR BLOOD PRESSURE SUPPORT)
PREFILLED_SYRINGE | INTRAVENOUS | Status: AC
  Filled 2022-03-19: qty 10

## 2022-03-19 MED ORDER — ROCURONIUM BROMIDE 10 MG/ML (PF) SYRINGE
PREFILLED_SYRINGE | INTRAVENOUS | Status: DC | PRN
  Administered 2022-03-19: 50 mg via INTRAVENOUS
  Administered 2022-03-19: 20 mg via INTRAVENOUS

## 2022-03-19 MED ORDER — ACETAMINOPHEN 500 MG PO TABS
1000.0000 mg | ORAL_TABLET | Freq: Once | ORAL | Status: AC
Start: 2022-03-19 — End: 2022-03-19
  Administered 2022-03-19: 1000 mg via ORAL
  Filled 2022-03-19: qty 2

## 2022-03-19 MED ORDER — MEPERIDINE HCL 25 MG/ML IJ SOLN: 6.2500 mg | INTRAMUSCULAR | Status: DC | PRN

## 2022-03-19 MED ORDER — MIDAZOLAM HCL 2 MG/2ML IJ SOLN: 0.5000 mg | Freq: Once | INTRAMUSCULAR | Status: DC | PRN

## 2022-03-19 MED ORDER — OXYCODONE HCL 5 MG/5ML PO SOLN: 5.0000 mg | Freq: Once | ORAL | Status: DC | PRN

## 2022-03-19 MED ORDER — LIDOCAINE 2% (20 MG/ML) 5 ML SYRINGE
INTRAMUSCULAR | Status: AC
  Filled 2022-03-19: qty 5

## 2022-03-19 MED ORDER — MIDAZOLAM HCL 2 MG/2ML IJ SOLN
INTRAMUSCULAR | Status: AC
  Filled 2022-03-19: qty 2

## 2022-03-19 MED ORDER — LIDOCAINE 2% (20 MG/ML) 5 ML SYRINGE
INTRAMUSCULAR | Status: DC | PRN
  Administered 2022-03-19: 80 mg via INTRAVENOUS

## 2022-03-19 MED ORDER — KETAMINE HCL-SODIUM CHLORIDE 100-0.9 MG/10ML-% IV SOSY
PREFILLED_SYRINGE | INTRAVENOUS | Status: DC | PRN
  Administered 2022-03-19: 10 mg via INTRAVENOUS
  Administered 2022-03-19: 30 mg via INTRAVENOUS
  Administered 2022-03-19: 10 mg via INTRAVENOUS

## 2022-03-19 MED ORDER — ONDANSETRON HCL 4 MG/2ML IJ SOLN
INTRAMUSCULAR | Status: AC
  Filled 2022-03-19: qty 2

## 2022-03-19 MED ORDER — PROPOFOL 10 MG/ML IV BOLUS
INTRAVENOUS | Status: AC
  Filled 2022-03-19: qty 20

## 2022-03-19 MED ORDER — DEXAMETHASONE SODIUM PHOSPHATE 10 MG/ML IJ SOLN
INTRAMUSCULAR | Status: DC | PRN
  Administered 2022-03-19: 10 mg via INTRAVENOUS

## 2022-03-19 MED ORDER — LIDOCAINE-EPINEPHRINE 1 %-1:100000 IJ SOLN
INTRAMUSCULAR | Status: DC | PRN
  Administered 2022-03-19: 10 mL

## 2022-03-19 MED ORDER — SUGAMMADEX SODIUM 200 MG/2ML IV SOLN
INTRAVENOUS | Status: DC | PRN
  Administered 2022-03-19: 200 mg via INTRAVENOUS

## 2022-03-19 MED ORDER — CHLORHEXIDINE GLUCONATE 0.12 % MT SOLN
15.0000 mL | OROMUCOSAL | Status: AC
  Administered 2022-03-19: 15 mL via OROMUCOSAL
  Filled 2022-03-19: qty 15

## 2022-03-19 MED ORDER — HYDROMORPHONE HCL 1 MG/ML IJ SOLN
INTRAMUSCULAR | Status: AC
  Filled 2022-03-19: qty 1

## 2022-03-19 MED ORDER — METHYLPREDNISOLONE ACETATE 80 MG/ML IJ SUSP
INTRAMUSCULAR | Status: AC
  Filled 2022-03-19: qty 1

## 2022-03-19 MED ORDER — LIDOCAINE-EPINEPHRINE 1 %-1:100000 IJ SOLN
INTRAMUSCULAR | Status: AC
  Filled 2022-03-19: qty 1

## 2022-03-19 MED ORDER — METHYLPREDNISOLONE ACETATE 80 MG/ML IJ SUSP
INTRAMUSCULAR | Status: DC | PRN
  Administered 2022-03-19: 80 mg

## 2022-03-19 MED ORDER — LACTATED RINGERS IV SOLN: INTRAVENOUS | Status: DC

## 2022-03-19 MED ORDER — ROCURONIUM BROMIDE 10 MG/ML (PF) SYRINGE
PREFILLED_SYRINGE | INTRAVENOUS | Status: AC
  Filled 2022-03-19: qty 10

## 2022-03-19 MED ORDER — ONDANSETRON HCL 4 MG/2ML IJ SOLN
INTRAMUSCULAR | Status: DC | PRN
  Administered 2022-03-19: 4 mg via INTRAVENOUS

## 2022-03-19 MED ORDER — THROMBIN 5000 UNITS EX SOLR
OROMUCOSAL | Status: DC | PRN
  Administered 2022-03-19: 5 mL via TOPICAL

## 2022-03-19 MED ORDER — CEFAZOLIN SODIUM-DEXTROSE 2-4 GM/100ML-% IV SOLN
INTRAVENOUS | Status: AC
  Filled 2022-03-19: qty 100

## 2022-03-19 SURGICAL SUPPLY — 54 items
ADH SKN CLS APL DERMABOND .7 (GAUZE/BANDAGES/DRESSINGS) ×1
BAG COUNTER SPONGE SURGICOUNT (BAG) ×3 IMPLANT
BAG SPNG CNTER NS LX DISP (BAG) ×2
BAND INSRT 18 STRL LF DISP RB (MISCELLANEOUS) ×2
BAND RUBBER #18 3X1/16 STRL (MISCELLANEOUS) ×4 IMPLANT
BLADE CLIPPER SURG (BLADE) IMPLANT
BLADE SURG 11 STRL SS (BLADE) ×2 IMPLANT
BUR MATCHSTICK NEURO 3.0 LAGG (BURR) ×2 IMPLANT
BUR PRECISION FLUTE 5.0 (BURR) IMPLANT
CANISTER SUCT 3000ML PPV (MISCELLANEOUS) ×2 IMPLANT
DERMABOND ADVANCED (GAUZE/BANDAGES/DRESSINGS) ×1
DERMABOND ADVANCED .7 DNX12 (GAUZE/BANDAGES/DRESSINGS) ×1 IMPLANT
DRAPE C-ARM 42X72 X-RAY (DRAPES) ×4 IMPLANT
DRAPE LAPAROTOMY 100X72X124 (DRAPES) ×2 IMPLANT
DRAPE MICROSCOPE LEICA (MISCELLANEOUS) ×2 IMPLANT
DRAPE SURG 17X23 STRL (DRAPES) ×2 IMPLANT
DURAPREP 26ML APPLICATOR (WOUND CARE) ×2 IMPLANT
ELECT BLADE 6.5 EXT (BLADE) ×2 IMPLANT
ELECT REM PT RETURN 9FT ADLT (ELECTROSURGICAL) ×2
ELECTRODE REM PT RTRN 9FT ADLT (ELECTROSURGICAL) ×1 IMPLANT
GAUZE 4X4 16PLY ~~LOC~~+RFID DBL (SPONGE) IMPLANT
GAUZE SPONGE 4X4 12PLY STRL (GAUZE/BANDAGES/DRESSINGS) IMPLANT
GLOVE BIOGEL PI IND STRL 7.5 (GLOVE) ×1 IMPLANT
GLOVE BIOGEL PI INDICATOR 7.5 (GLOVE) ×1
GLOVE ECLIPSE 7.5 STRL STRAW (GLOVE) ×2 IMPLANT
GLOVE EXAM NITRILE LRG STRL (GLOVE) IMPLANT
GLOVE EXAM NITRILE XL STR (GLOVE) IMPLANT
GLOVE EXAM NITRILE XS STR PU (GLOVE) IMPLANT
GOWN STRL REUS W/ TWL LRG LVL3 (GOWN DISPOSABLE) ×2 IMPLANT
GOWN STRL REUS W/ TWL XL LVL3 (GOWN DISPOSABLE) IMPLANT
GOWN STRL REUS W/TWL 2XL LVL3 (GOWN DISPOSABLE) IMPLANT
GOWN STRL REUS W/TWL LRG LVL3 (GOWN DISPOSABLE) ×4
GOWN STRL REUS W/TWL XL LVL3 (GOWN DISPOSABLE)
HEMOSTAT POWDER KIT SURGIFOAM (HEMOSTASIS) ×2 IMPLANT
KIT BASIN OR (CUSTOM PROCEDURE TRAY) ×2 IMPLANT
KIT TURNOVER KIT B (KITS) ×2 IMPLANT
NDL HYPO 18GX1.5 BLUNT FILL (NEEDLE) IMPLANT
NDL SPNL 18GX3.5 QUINCKE PK (NEEDLE) ×1 IMPLANT
NEEDLE HYPO 18GX1.5 BLUNT FILL (NEEDLE) ×2 IMPLANT
NEEDLE HYPO 22GX1.5 SAFETY (NEEDLE) ×2 IMPLANT
NEEDLE SPNL 18GX3.5 QUINCKE PK (NEEDLE) ×2 IMPLANT
NS IRRIG 1000ML POUR BTL (IV SOLUTION) ×2 IMPLANT
PACK LAMINECTOMY NEURO (CUSTOM PROCEDURE TRAY) ×2 IMPLANT
PAD ARMBOARD 7.5X6 YLW CONV (MISCELLANEOUS) ×6 IMPLANT
SPIKE FLUID TRANSFER (MISCELLANEOUS) ×2 IMPLANT
SPONGE T-LAP 4X18 ~~LOC~~+RFID (SPONGE) IMPLANT
SUT MNCRL AB 3-0 PS2 18 (SUTURE) ×1 IMPLANT
SUT VIC AB 0 CT1 18XCR BRD8 (SUTURE) IMPLANT
SUT VIC AB 0 CT1 8-18 (SUTURE)
SUT VIC AB 2-0 CP2 18 (SUTURE) ×2 IMPLANT
SYR 3ML LL SCALE MARK (SYRINGE) ×1 IMPLANT
TOWEL GREEN STERILE (TOWEL DISPOSABLE) ×2 IMPLANT
TOWEL GREEN STERILE FF (TOWEL DISPOSABLE) ×2 IMPLANT
WATER STERILE IRR 1000ML POUR (IV SOLUTION) ×2 IMPLANT

## 2022-03-19 NOTE — Evaluation (Signed)
Occupational Therapy Evaluation Patient Details Name: Emma Johnson MRN: [id number] DOB: [birth year] Today's Date: 03/19/2022   History of Present Illness Emma Johnson is a 46 yo female who presented with severe back and RLE pain, suspected to be due to recurrent far lateral disc herniation. She is s/p right minimally invasive L3-4 discectomy 8/9. 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 However within the last week her RLE pain has prevented her from ambulation. Upon arrival, pt was standing OOB at the sink with family. She required min G throughout all ambulation for safety, and benefited from cues for RW management. Reviewed back precaution education and pt verbalized understanding but will benefit from OT acutely to review AE and compensatory techniques. Pt noted to have impaired cognition this date, likely due to medication. OT to continue to follow. Anticipate great progression without need for follow up OT.      Recommendations for follow up therapy are one component of a multi-disciplinary discharge planning process, led by the attending physician.  Recommendations may be updated based on patient status, additional functional criteria and insurance authorization.   Follow Up Recommendations  No OT follow up    Assistance Recommended at Discharge Frequent or constant Supervision/Assistance  Patient can return home with the following A little help with walking and/or transfers;A little help with bathing/dressing/bathroom;Assistance with cooking/housework;Direct supervision/assist for medications management;Direct supervision/assist for financial management;Assist for transportation;Help with stairs or ramp for entrance    Functional Status Assessment  Patient has had a recent decline in their functional status and demonstrates the ability to make significant  improvements in function in a reasonable and predictable amount of time.  Equipment Recommendations  None recommended by OT    Recommendations for Other Services       Precautions / Restrictions Precautions Precautions: Fall;Back Precaution Booklet Issued: Yes (comment) Precaution Comments: no brace needed Restrictions Weight Bearing Restrictions: No      Mobility Bed Mobility               General bed mobility comments: pt OOB upon arrival    Transfers Overall transfer level: Needs assistance Equipment used: Rolling walker (2 wheels) Transfers: Sit to/from Stand Sit to Stand: Min guard                  Balance Overall balance assessment: Needs assistance Sitting-balance support: Feet supported Sitting balance-Leahy Scale: Fair     Standing balance support: Single extremity supported, During functional activity Standing balance-Leahy Scale: Fair                             ADL either performed or assessed with clinical judgement   ADL Overall ADL's : Needs assistance/impaired Eating/Feeding: Independent;Sitting   Grooming: Supervision/safety;Standing   Upper Body Bathing: Set up;Sitting   Lower Body Bathing: Minimal assistance;Sit to/from stand   Upper Body Dressing : Set up;Sitting   Lower Body Dressing: Minimal assistance;Sit to/from stand   Toilet Transfer: Min guard;Ambulation;Rolling walker (2 wheels)   Toileting- Clothing Manipulation and Hygiene: Supervision/safety;Sitting/lateral lean       Functional mobility during ADLs: Min guard;Rolling walker (2 wheels) General ADL Comments: cues for back precautions and RW management     Vision Baseline Vision/History: 1 Wears glasses Ability to See in Adequate Light: 0 Adequate Vision Assessment?: No apparent visual deficits  Perception     Praxis      Pertinent Vitals/Pain Pain Assessment Pain Assessment: Faces Faces Pain Scale: Hurts little more Pain Location:  back, RLE Pain Descriptors / Indicators: Grimacing Pain Intervention(s): Limited activity within patient's tolerance, Monitored during session     Hand Dominance Right   Extremity/Trunk Assessment Upper Extremity Assessment Upper Extremity Assessment: Overall WFL for tasks assessed   Lower Extremity Assessment Lower Extremity Assessment: Defer to PT evaluation   Cervical / Trunk Assessment Cervical / Trunk Assessment: Back Surgery   Communication Communication Communication: No difficulties   Cognition Arousal/Alertness: Awake/alert Behavior During Therapy: WFL for tasks assessed/performed Overall Cognitive Status: Impaired/Different from baseline Area of Impairment: Attention, Safety/judgement, Awareness                   Current Attention Level: Sustained     Safety/Judgement: Decreased awareness of safety, Decreased awareness of deficits Awareness: Emergent   General Comments: pt with slow processing and poor attention, likely due to medicaiton     General Comments  VSS on RA, family present at the end of the session    Exercises     Shoulder Instructions      Home Living Family/patient expects to be discharged to:: Private residence Living Arrangements: Spouse/significant other;Children Available Help at Discharge: Family;Available 24 hours/day Type of Home: House Home Access: Stairs to enter [employer] of Steps: 4-5 Entrance Stairs-Rails: Can reach both Home Layout: One level     Bathroom Shower/Tub: Teacher, early years/pre: Handicapped height     Home Equipment: Conservation officer, nature (2 wheels);Cane - single point;BSC/3in1;Shower seat;Adaptive equipment;Wheelchair - Higher education careers adviser: Reacher Additional Comments: Konrad Dolores is fiance      Prior Functioning/Environment Prior Level of Function : Independent/Modified Independent             Mobility Comments: no AD          OT Problem List: Decreased  strength;Decreased range of motion;Decreased activity tolerance;Impaired balance (sitting and/or standing);Decreased safety awareness;Decreased knowledge of precautions;Pain      OT Treatment/Interventions: Self-care/ADL training;Therapeutic exercise;DME and/or AE instruction;Therapeutic activities;Patient/family education;Balance training    OT Goals(Current goals can be found in the care plan section) Acute Rehab OT Goals Patient Stated Goal: home soon OT Goal Formulation: With patient Time For Goal Achievement: 04/02/22 Potential to Achieve Goals: Good ADL Goals Additional ADL Goal #1: pt will indep recall back precautions and maintain during ADLs  OT Frequency: Min 2X/week    Co-evaluation              AM-PAC OT "6 Clicks" Daily Activity     Outcome Measure Help from another person eating meals?: None Help from another person taking care of personal grooming?: A Little Help from another person toileting, which includes using toliet, bedpan, or urinal?: A Little Help from another person bathing (including washing, rinsing, drying)?: A Little Help from another person to put on and taking off regular upper body clothing?: None Help from another person to put on and taking off regular lower body clothing?: A Little 6 Click Score: 20   End of Session Equipment Utilized During Treatment: Gait belt;Rolling walker (2 wheels) Nurse Communication: Mobility status  Activity Tolerance: Patient tolerated treatment well Patient left: in bed;with call bell/phone within reach;with bed alarm set;with family/visitor present  OT Visit Diagnosis: Other abnormalities of gait and mobility (R26.89);Pain;Muscle weakness (generalized) (M62.81)                Time: 2683-4196 OT  Time Calculation (min): 24 min Charges:  OT General Charges $OT Visit: 1 Visit OT Evaluation $OT Eval Moderate Complexity: 1 Mod OT Treatments $Self Care/Home Management : 8-22 mins    Briggs Edelen A Jabori Henegar 03/19/2022,  6:02 PM

## 2022-03-19 NOTE — Anesthesia Procedure Notes (Signed)
Procedure Name: Intubation Date/Time: 03/19/2022 1:15 PM  Performed by: Genelle Bal, CRNAPre-anesthesia Checklist: Patient identified, Emergency Drugs available, Suction available and Patient being monitored Patient Re-evaluated:Patient Re-evaluated prior to induction Oxygen Delivery Method: Circle system utilized Preoxygenation: Pre-oxygenation with 100% oxygen Induction Type: IV induction Ventilation: Mask ventilation without difficulty Laryngoscope Size: Miller and 2 Grade View: Grade II Tube type: Oral Tube size: 7.0 mm Number of attempts: 1 Airway Equipment and Method: Stylet Placement Confirmation: ETT inserted through vocal cords under direct vision, positive ETCO2 and breath sounds checked- equal and bilateral Secured at: 21 cm Tube secured with: Tape Dental Injury: Teeth and Oropharynx as per pre-operative assessment  Comments: Inserted by Laurence Spates

## 2022-03-19 NOTE — Progress Notes (Signed)
OT Cancellation Note  Patient Details Name: Emma Johnson MRN: [id number] DOB: [birth year]   Cancelled Treatment:    Reason Eval/Treat Not Completed: Patient at procedure or test/ unavailable (Pt in OR - OT eval to f/u when appropriate.)  Amora Sheehy A Shanik [place] 03/19/2022, 12:40 PM

## 2022-03-19 NOTE — Progress Notes (Signed)
Subjective: Patient reports still having a moderate amount of left anterior thigh pain that radiates to her shin. Weakness in right leg  Objective: Vital signs in last 24 hours: Temp:  [98 F (36.7 C)-98.5 F (36.9 C)] 98.5 F (36.9 C) (08/09 0836) Pulse Rate:  [61-67] 63 (08/09 0836) Resp:  [14-20] 19 (08/09 0836) BP: (105-125)/(50-59) 111/59 (08/09 0836) SpO2:  [92 %-96 %] 93 % (08/09 0836)  Intake/Output from previous day: 08/08 0701 - 08/09 0700 In: 32.5 [I.V.:32.5] Out: 600 [Urine:600] Intake/Output this shift: No intake/output data recorded.  Neurologic: Grossly normal, right knee extension and hip flexor 4/5  Lab Results: Lab Results  Component Value Date   WBC 13.4 (H) 03/17/2022   HGB 14.9 03/17/2022   HCT 45.4 03/17/2022   MCV 90.4 03/17/2022   PLT 188 03/17/2022   No results found for: "INR", "PROTIME" BMET Lab Results  Component Value Date   NA 138 03/17/2022   K 3.8 03/17/2022   CL 109 03/17/2022   CO2 27 03/17/2022   GLUCOSE 111 (H) 03/17/2022   BUN 17 03/17/2022   CREATININE 0.74 03/17/2022   CALCIUM 8.7 (L) 03/17/2022    Studies/Results: No results found.  Assessment/Plan: Dr. Zada Finders is kindly taking care of this patient for [place] while Dr. Ronnald Ramp is out of town. Planning for OR today for a microdiskectomy.    LOS: 1 day    Emma Johnson 03/19/2022, 10:34 AM

## 2022-03-19 NOTE — Progress Notes (Signed)
PT Cancellation Note  Patient Details Name: Emma Johnson MRN: [id number] DOB: [birth year]   Cancelled Treatment:    Reason Eval/Treat Not Completed: Other (comment);Pain limiting ability to participate. Coordinated with pt and decided to hold PT Eval after surgery planned for later this morning. Will follow-up later as time permits.   Moishe Spice, PT, DPT Acute Rehabilitation Services  Office: [place] 03/19/2022, 10:31 AM

## 2022-03-19 NOTE — Anesthesia Preprocedure Evaluation (Addendum)
Anesthesia Evaluation  Patient identified by MRN, date of birth, ID band Patient awake    Reviewed: Allergy & Precautions, NPO status , Patient's Chart, lab work & pertinent test results  History of Anesthesia Complications Negative for: history of anesthetic complications  Airway Mallampati: II  TM Distance: >3 FB Neck ROM: Full    Dental  (+) Dental Advisory Given   Pulmonary Current Smoker and Patient abstained from smoking.,    breath sounds clear to auscultation       Cardiovascular negative cardio ROS   Rhythm:Regular Rate:Normal     Neuro/Psych Anxiety Depression Back pain    GI/Hepatic Neg liver ROS, GERD  Medicated and Controlled,  Endo/Other  negative endocrine ROSobese  Renal/GU negative Renal ROS     Musculoskeletal   Abdominal (+) + obese,   Peds  Hematology negative hematology ROS (+)   Anesthesia Other Findings   Reproductive/Obstetrics                            Anesthesia Physical Anesthesia Plan  ASA: 2  Anesthesia Plan: General   Post-op Pain Management: Tylenol PO (pre-op)*   Induction: Intravenous  PONV Risk Score and Plan: 2 and Ondansetron and Dexamethasone  Airway Management Planned: Oral ETT  Additional Equipment: None  Intra-op Plan:   Post-operative Plan: Extubation in OR  Informed Consent: I have reviewed the patients History and Physical, chart, labs and discussed the procedure including the risks, benefits and alternatives for the proposed anesthesia with the patient or authorized representative who has indicated his/her understanding and acceptance.     Dental advisory given  Plan Discussed with: CRNA and Surgeon  Anesthesia Plan Comments:        Anesthesia Quick Evaluation

## 2022-03-19 NOTE — Transfer of Care (Signed)
Immediate Anesthesia Transfer of Care Note  Patient: Emma Johnson  Procedure(s) Performed: RIGHT MINIMALLY INVASIVE LUMBAR THREE-FOUR DISCECTOMY  Patient Location: PACU  Anesthesia Type:General  Level of Consciousness: awake, alert  and oriented  Airway & Oxygen Therapy: Patient Spontanous Breathing and Patient connected to face mask oxygen  Post-op Assessment: Report given to RN and Post -op Vital signs reviewed and stable  Post vital signs: Reviewed and stable  Last Vitals:  Vitals Value Taken Time  BP 118/73 03/19/22 1451  Temp    Pulse 82 03/19/22 1453  Resp 13 03/19/22 1453  SpO2 96 % 03/19/22 1453  Vitals shown include unvalidated device data.  Last Pain:  Vitals:   03/19/22 1218  TempSrc: Oral  PainSc:       Patients Stated Pain Goal: 2 (82/42/35 3614)  Complications: No notable events documented.

## 2022-03-19 NOTE — Op Note (Addendum)
PATIENT: Emma Johnson  DAY OF SURGERY: 03/19/22   PRE-OPERATIVE DIAGNOSIS:  Lumbar radiculopathy   POST-OPERATIVE DIAGNOSIS:  Same   PROCEDURE:  Right minimally invasive L3-4 discectomy   SURGEON:  Surgeon(s) and Role:    Judith Part, MD - Primary   ANESTHESIA: ETGA   BRIEF HISTORY: This is a 46 year old woman who presented with acute on chronic low back and RLE pain. She previously was cared for by one of my partners, who was out of town, so I assumed her care in the meantime. She was newly non-ambulatory due to severe RLE pain with some subjective weakness in the right leg. 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 Her new MRI showed a recurrent far lateral disc herniation at L3-4 on the right. She previously had improvement in her pain with a prior discectomy in that location. Given her loss of ambulation due to pain, we discussed options. I was very frank that I am unsure of the success rate of improving her pain and a reasonable goal would be to hopefully decrease her pain, I think complete resolution is highly unlikely. She was miserable, stated she didn't want to live like this. The above was discussed with the patient as well as risks, benefits, and alternatives and the patient wished to proceed with surgical treatment.    OPERATIVE DETAIL: The patient was taken to the operating room and placed on the OR table in the prone position. A formal time out was performed with two patient identifiers and confirmed the operative site. Anesthesia was induced by the anesthesia team. The operative site was marked, hair was clipped with surgical clippers, the area was then prepped and draped in a sterile fashion. Fluoroscopy was used to identify the surgical level prior to incision.   A 2cm incision was then marked to the right of midline overlying the  L3-4 disc space just lateral to the pedicle. The incision was anesthetized and opened, the fascia was incised sharply and serial dilators were docked to the lamino-facet junction using fluoroscopy to confirm position as well as perform a second count to confirm the correct surgical level. After a final dilator was placed, a tubular retractor was placed over this and secured to the table. The operating microscope was draped and brought into the field. Anatomy was palpated and confirmed, the lateral facet and pars were identified and the foramen was palpated to confirm anatomy.   There was scar tissue along the length of the approach as the current and prior trajectories intersected at the level of the facet, consistent with the patient's history of prior surgery at this level. Kerrison rongeurs were then used to remove a small portion of the superior aspect of the right L3-4 facet. There was scar material posterior to the root which was removed. The root was large and densely scarred in. I couldn't develop a plane safely, so I removed the ligament more medially up to the traversing root and worked my way superiorly to the origin of the exiting root and then followed it out, which was successful in dissecting it free. It was then protected during disc work. There were multiple large fragments of disc material around the root that were removed. They extended up into the ventral foramen and, after the root was free, I used Epsteins to reduce it into the disc space and remove them.  There was extensive disc laterally as well, again removed. After significant discectomy, the root was finally clear and free from decompression on palpation in all directions in the field. Given the patient's preop symptom severity, I also placed depo medrol along the root to help with post-op pain.   Hemostasis was obtained and confirmed, the wound was copiously irrigated, and the tube was removed while using the microscope to confirm  hemostasis of the muscle edges. All instrument and sponge counts were correct and the incision was then closed in layers. The patient was then returned to anesthesia for emergence. No apparent complications at the completion of the procedure.    EBL:  45m   DRAINS: none   SPECIMENS: none   TJudith Part MD 03/19/22 12:45 PM

## 2022-03-19 NOTE — Progress Notes (Signed)
Neurosurgery Service Progress Note  Subjective: No acute events overnight, no changes or improvement in pain   Objective: Vitals:   03/18/22 1941 03/18/22 2320 03/19/22 0324 03/19/22 0836  BP: (!) 112/54 (!) 109/57 (!) 105/50 (!) 111/59  Pulse: 64 66 65 63  Resp: '20 20 20 19  '$ Temp: 98.4 F (36.9 C) 98.4 F (36.9 C) 98.4 F (36.9 C) 98.5 F (36.9 C)  TempSrc: Oral Oral Oral Oral  SpO2: 95% 92% 93% 93%  Weight:      Height:        Physical Exam: Strength 5/5 x4 except pain limited in the proximal R leg, SILTx4 except for R anterior thigh numbness most c/w proximal L3 versus L2 dermatome  Assessment & Plan: 46 y.o. woman with severe back and RLE pain, suspected to be due to recurrent far lateral disc herniation.  -OR today for repeat far lateral discectomy  Emma Johnson  03/19/22 9:30 AM

## 2022-03-20 ENCOUNTER — Encounter (HOSPITAL_COMMUNITY): Payer: Self-pay | Admitting: Neurological Surgery

## 2022-03-20 NOTE — Discharge Summary (Signed)
Discharge Summary  Date of Admission: 03/17/2022  Date of Discharge: 03/20/22  Attending Physician: Emelda Brothers, MD  Hospital Course: Patient was admitted for severe low back / RLE pain. MRI showed a large recurrent right L3-4 far lateral disc herniation. They were taken to the OR on 8/9 for a repeat R MIS far lateral discectomy. Her pain was dramatically improved post-op, their hospital course was uncomplicated and the patient was discharged home on 03/20/22. They will follow up in clinic with me in clinic in 2 weeks.  Neurologic exam at discharge:  Strength 5/5 x4 except some mild R HF weakness and SILTx4  Discharge diagnosis: Lumbar radiculopathy  Judith Part, MD 03/20/22 10:17 AM

## 2022-03-20 NOTE — Progress Notes (Signed)
Occupational Therapy Treatment Patient Details Name: Emma Johnson MRN: [id number] DOB: [birth year] Today's Date: 03/20/2022   History of present illness Emma Johnson is a 46 yo female who presented with severe back and RLE pain, suspected to be due to recurrent far lateral disc herniation. She is s/p right minimally invasive L3-4 discectomy 8/9. PMHx: discectomy 2022   OT comments  Completed all education regarding back precautions and use of compensatory techniques and AE/DME for ADL. Pt able to return demonstrate. Goals met. No further OT needed. OT signing off.    Recommendations for follow up therapy are one component of a multi-disciplinary discharge planning process, led by the attending physician.  Recommendations may be updated based on patient status, additional functional criteria and insurance authorization.    Follow Up Recommendations  No OT follow up    Assistance Recommended at Discharge Intermittent Supervision/Assistance  Patient can return home with the following  A little help with walking and/or transfers;A little help with bathing/dressing/bathroom;Assistance with cooking/housework;Direct supervision/assist for medications management;Direct supervision/assist for financial management;Assist for transportation;Help with stairs or ramp for entrance   Equipment Recommendations  None recommended by OT    Recommendations for Other Services      Precautions / Restrictions Precautions Precautions: Fall;Back Precaution Booklet Issued: Yes (comment) Precaution Comments: no brace needed       Mobility Bed Mobility               General bed mobility comments: sitting EOB    Transfers Overall transfer level: Modified independent                 General transfer comment: modified independent from higher surface     Balance                                           ADL either performed or assessed with clinical  judgement   ADL                                         General ADL Comments: Completed education regarding back precautions for ADL and mobility with AE (sock aid, long handled sponge, reacher, toilet tongs if needed); educated on compensatory strategies; recommend pt use shower chair/3in1 for bathing and 3in1 over toilet to help with trnasfers. PT verbalized understanding.    Extremity/Trunk Assessment              Vision       Perception     Praxis      Cognition Arousal/Alertness: Awake/alert Behavior During Therapy: WFL for tasks assessed/performed Overall Cognitive Status: Within Functional Limits for tasks assessed                                          Exercises      Shoulder Instructions       General Comments      Pertinent Vitals/ Pain       Pain Assessment Pain Assessment: 0-10 Pain Score: 3  Pain Location: back, RLE Pain Descriptors / Indicators: Grimacing Pain Intervention(s): Limited activity within patient's tolerance  Home Living  Prior Functioning/Environment              Frequency           Progress Toward Goals  OT Goals(current goals can now be found in the care plan section)  Progress towards OT goals: Goals met/education completed, patient discharged from OT  Acute Rehab OT Goals Patient Stated Goal: home today OT Goal Formulation: With patient/family ADL Goals Additional ADL Goal #1: pt will indep recall back precautions and maintain during ADLs  Plan All goals met and education completed, patient discharged from OT services    Co-evaluation                 AM-PAC OT "6 Clicks" Daily Activity     Outcome Measure   Help from another person eating meals?: None Help from another person taking care of personal grooming?: A Little Help from another person toileting, which includes using toliet, bedpan, or urinal?:  None Help from another person bathing (including washing, rinsing, drying)?: A Little Help from another person to put on and taking off regular upper body clothing?: None Help from another person to put on and taking off regular lower body clothing?: A Little 6 Click Score: 21    End of Session Equipment Utilized During Treatment: Rolling walker (2 wheels)  OT Visit Diagnosis: Other abnormalities of gait and mobility (R26.89);Pain;Muscle weakness (generalized) (M62.81) Pain - part of body:  (back; R leg)   Activity Tolerance Patient tolerated treatment well   Patient Left in bed;with call bell/phone within reach;with family/visitor present (sitting EOB)   Nurse Communication Mobility status        Time: 8208-1388 OT Time Calculation (min): 25 min  Charges: OT General Charges $OT Visit: 1 Visit OT Treatments $Self Care/Home Management : 23-37 mins  Maurie Boettcher, OT/L   Acute OT Clinical Specialist [place] Pager [phone] Office [phone]   [facility] 03/20/2022, 9:38 AM

## 2022-03-20 NOTE — Evaluation (Signed)
Physical Therapy Evaluation Patient Details Name: Emma Johnson MRN: [id number] DOB: Apr 08, 1976 Today's Date: 03/20/2022  History of Present Illness  Emma Johnson is a 46 yo female who presented with severe back and RLE pain, suspected to be due to recurrent far lateral disc herniation. She is s/p right minimally invasive L3-4 discectomy 8/9. PMHx: discectomy 2022.    Clinical Impression  Pt in bed upon arrival of PT, agreeable to evaluation at this time. Prior to admission the pt was completely independent without use of DME until recent increase in pain. The pt now presents with limitations in functional mobility, strength, power, and endurance due to above dx, and will continue to benefit from skilled PT to address these deficits. The pt was able to complete bed mobility with cues for log roll and spinal precautions, demos no recall from prior education or training. The pt was then able to complete sit-stand transfers, hallway ambulation, and stairs with minG-supervision. She asked to continue use of RW, but demos good stability for gait without use of RW at this time as well. 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 Will continue to follow acutely to maintain mobility and progress to independence without use of DME, is safe to return home with family support when medically cleared.   Gait Speed: 0.32ms using RW. (Gait speed < 1.067m indicates increased risk of falls)       Recommendations for follow up therapy are one component of a multi-disciplinary discharge planning process, led by the attending physician.  Recommendations may be updated based on patient status, additional functional criteria and insurance  authorization.  Follow Up Recommendations No PT follow up      Assistance Recommended at Discharge Intermittent Supervision/Assistance  Patient can return home with the following  Help with stairs or ramp for entrance    Equipment Recommendations None recommended by PT  Recommendations for Other Services       Functional Status Assessment Patient has had a recent decline in their functional status and demonstrates the ability to make significant improvements in function in a reasonable and predictable amount of time.     Precautions / Restrictions Precautions Precautions: Fall;Back Precaution Booklet Issued: Yes (comment) Precaution Comments: no brace needed Restrictions Weight Bearing Restrictions: No      Mobility  Bed Mobility Overal bed mobility: Needs Assistance Bed Mobility: Rolling, Sidelying to Sit, Sit to Sidelying Rolling: Min guard Sidelying to sit: Min guard     Sit to sidelying: Min guard General bed mobility comments: minG with cues for technique    Transfers Overall transfer level: Needs assistance Equipment used: Rolling walker (2 wheels) Transfers: Sit to/from Stand Sit to Stand: Min guard           General transfer comment: minG with increased time to rise    Ambulation/Gait Ambulation/Gait assistance: Supervision Gait Distance (Feet): 250 Feet Assistive device: Rolling walker (2 wheels), None Gait Pattern/deviations: Step-through pattern Gait velocity: 0.71 m/s Gait velocity interpretation: 1.31 - 2.62 ft/sec, indicative of limited community ambulator   General Gait Details: pt with quick and smooth ambulaiton, minimal support on RW but reports she would like to continue use. Unable to state why. able to complete without UE support with good stability.  Stairs  Stairs: Yes Stairs assistance: Supervision Stair Management: One rail Right, Alternating pattern, Forwards Number of Stairs: 10 General stair comments: alternating ascending,  LLE leading descending     Balance Overall balance assessment: Needs assistance Sitting-balance support: Feet supported Sitting balance-Leahy Scale: Fair     Standing balance support: Single extremity supported, During functional activity Standing balance-Leahy Scale: Good Standing balance comment: can ambulate without UE support                             Pertinent Vitals/Pain Pain Assessment Pain Assessment: Faces Faces Pain Scale: Hurts little more Pain Location: back, RLE Pain Descriptors / Indicators: Grimacing Pain Intervention(s): Monitored during session, Repositioned, Limited activity within patient's tolerance    Home Living Family/patient expects to be discharged to:: Private residence Living Arrangements: Spouse/significant other;Children Available Help at Discharge: Family;Available 24 hours/day Type of Home: House Home Access: Stairs to enter Entrance Stairs-Rails: Can reach both Entrance Stairs-Number of Steps: 4-5   Home Layout: One level Home Equipment: Conservation officer, nature (2 wheels);Cane - single point;BSC/3in1;Shower seat;Adaptive equipment;Wheelchair - manual      Prior Function Prior Level of Function : Independent/Modified Independent             Mobility Comments: no AD       Hand Dominance   Dominant Hand: Right    Extremity/Trunk Assessment   Upper Extremity Assessment Upper Extremity Assessment: Defer to OT evaluation    Lower Extremity Assessment Lower Extremity Assessment: RLE deficits/detail RLE Deficits / Details: pt reports CRPS limiting active movement, unable to demo any movement against gravity or to MMT, but reports no buckling or instability with gait. able to ascend stairs without issue with RLE leading RLE: Unable to fully assess due to pain    Cervical / Trunk Assessment Cervical / Trunk Assessment: Back Surgery  Communication   Communication: No difficulties  Cognition Arousal/Alertness:  Awake/alert Behavior During Therapy: WFL for tasks assessed/performed Overall Cognitive Status: Impaired/Different from baseline Area of Impairment: Attention, Safety/judgement, Awareness                   Current Attention Level: Sustained     Safety/Judgement: Decreased awareness of safety, Decreased awareness of deficits Awareness: Emergent   General Comments: pt with limited attention and safety awareness, unable to recall back precautions or log roll technique even from training within session. likely close to baseline personality as family is not concerned        General Comments General comments (skin integrity, edema, etc.): VSS on RA        Assessment/Plan    PT Assessment Patient needs continued PT services  PT Problem List Decreased strength;Pain;Decreased activity tolerance;Decreased balance       PT Treatment Interventions DME instruction;Gait training;Stair training;Functional mobility training;Therapeutic activities;Therapeutic exercise;Balance training    PT Goals (Current goals can be found in the Care Plan section)  Acute Rehab PT Goals Patient Stated Goal: return home, be able to walk to her daughter's sports games PT Goal Formulation: With patient Time For Goal Achievement: 04/03/22 Potential to Achieve Goals: Good    Frequency Min 5X/week        AM-PAC PT "6 Clicks" Mobility  Outcome Measure Help needed turning from your back to your side while in a flat bed without using bedrails?: A Little Help needed moving from lying on your back to sitting on the side of a flat bed without using bedrails?: A Little Help needed  moving to and from a bed to a chair (including a wheelchair)?: A Little Help needed standing up from a chair using your arms (e.g., wheelchair or bedside chair)?: A Little Help needed to walk in hospital room?: A Little Help needed climbing 3-5 steps with a railing? : A Little 6 Click Score: 18    End of Session Equipment  Utilized During Treatment: Gait belt Activity Tolerance: Patient tolerated treatment well Patient left: in bed;with family/visitor present (sitting EOB) Nurse Communication: Mobility status PT Visit Diagnosis: Other abnormalities of gait and mobility (R26.89);Muscle weakness (generalized) (M62.81)    Time: 2876-8115 PT Time Calculation (min) (ACUTE ONLY): 15 min   Charges:   PT Evaluation $PT Eval Low Complexity: 1 Low          West Carbo, PT, DPT   Acute Rehabilitation Department  Sandra Cockayne 03/20/2022, 11:41 AM

## 2022-03-20 NOTE — Anesthesia Postprocedure Evaluation (Signed)
Anesthesia Post Note  Patient: Emma Johnson  Procedure(s) Performed: RIGHT MINIMALLY INVASIVE LUMBAR THREE-FOUR DISCECTOMY     Patient location during evaluation: PACU Anesthesia Type: General Level of consciousness: awake and alert Pain management: pain level controlled Vital Signs Assessment: post-procedure vital signs reviewed and stable Respiratory status: spontaneous breathing, nonlabored ventilation, respiratory function stable and patient connected to nasal cannula oxygen Cardiovascular status: blood pressure returned to baseline and stable Postop Assessment: no apparent nausea or vomiting Anesthetic complications: no   No notable events documented.  Last Vitals:  Vitals:   03/20/22 0324 03/20/22 0829  BP: (!) 111/48 109/78  Pulse: 75 84  Resp: 18 18  Temp: 36.8 C 36.9 C  SpO2: 95% 93%    Last Pain:  Vitals:   03/20/22 0829  TempSrc: Oral  PainSc: 3                  Tiajuana Amass

## 2022-03-20 NOTE — Progress Notes (Signed)
Neurosurgery Service Progress Note  Subjective: No acute events overnight, dramatically better post-op, she's very happy, regained ambulation and was able to ambulate to the bathroom w/o assistance overnight  Objective: Vitals:   03/19/22 1933 03/19/22 2311 03/20/22 0324 03/20/22 0829  BP: (!) 116/47 111/60 (!) 111/48 109/78  Pulse: 71 71 75 84  Resp: '18 20 18 18  '$ Temp: 98.9 F (37.2 C) 98.5 F (36.9 C) 98.2 F (36.8 C) 98.5 F (36.9 C)  TempSrc: Oral Oral Oral Oral  SpO2: 94% 94% 95% 93%  Weight:      Height:        Physical Exam: Strength 5/5 x4 and SILTx4, incision c/d/i  Assessment & Plan: 46 y.o. woman w/ acute on chronic RLE / LBP s/p R L3-4 far lateral MIS discectomy, recovering well.  -discharge home today  Judith Part  03/20/22 10:14 AM

## 2022-08-21 DIAGNOSIS — G90521 Complex regional pain syndrome I of right lower limb: Secondary | ICD-10-CM | POA: Diagnosis not present

## 2022-08-21 DIAGNOSIS — Z79899 Other long term (current) drug therapy: Secondary | ICD-10-CM | POA: Diagnosis not present

## 2022-08-21 DIAGNOSIS — Z6829 Body mass index (BMI) 29.0-29.9, adult: Secondary | ICD-10-CM | POA: Diagnosis not present

## 2022-10-09 DIAGNOSIS — M5416 Radiculopathy, lumbar region: Secondary | ICD-10-CM | POA: Diagnosis not present

## 2022-10-14 ENCOUNTER — Other Ambulatory Visit: Payer: Self-pay | Admitting: Neurological Surgery

## 2022-10-14 DIAGNOSIS — M5416 Radiculopathy, lumbar region: Secondary | ICD-10-CM

## 2022-10-31 ENCOUNTER — Encounter: Payer: Self-pay | Admitting: Neurological Surgery

## 2022-11-09 ENCOUNTER — Ambulatory Visit
Admission: RE | Admit: 2022-11-09 | Discharge: 2022-11-09 | Disposition: A | Discharge status: Home / Self Care | Payer: BC Managed Care – PPO | Source: Ambulatory Visit | Attending: Neurological Surgery | Admitting: Neurological Surgery

## 2022-11-09 DIAGNOSIS — M47817 Spondylosis without myelopathy or radiculopathy, lumbosacral region: Secondary | ICD-10-CM | POA: Diagnosis not present

## 2022-11-09 DIAGNOSIS — M5416 Radiculopathy, lumbar region: Secondary | ICD-10-CM

## 2022-11-09 DIAGNOSIS — M5127 Other intervertebral disc displacement, lumbosacral region: Secondary | ICD-10-CM | POA: Diagnosis not present

## 2022-11-09 MED ORDER — GADOPICLENOL 0.5 MMOL/ML IV SOLN
10.0000 mL | Freq: Once | INTRAVENOUS | Status: AC | PRN
  Administered 2022-11-09: 10 mL via INTRAVENOUS

## 2022-11-20 DIAGNOSIS — Z6829 Body mass index (BMI) 29.0-29.9, adult: Secondary | ICD-10-CM | POA: Diagnosis not present

## 2022-11-20 DIAGNOSIS — G90521 Complex regional pain syndrome I of right lower limb: Secondary | ICD-10-CM | POA: Diagnosis not present

## 2022-11-20 DIAGNOSIS — Z6830 Body mass index (BMI) 30.0-30.9, adult: Secondary | ICD-10-CM | POA: Diagnosis not present

## 2022-11-20 DIAGNOSIS — M5416 Radiculopathy, lumbar region: Secondary | ICD-10-CM | POA: Diagnosis not present

## 2022-11-24 ENCOUNTER — Other Ambulatory Visit: Payer: Self-pay | Admitting: Neurological Surgery

## 2022-12-03 NOTE — Pre-Procedure Instructions (Addendum)
Surgical Instructions    Your procedure is scheduled on Wednesday, May 1.  Report to [facility] Main Entrance "A" at 6:30 A.M., then check in with the Admitting office.  Call this number if you have problems the morning of surgery:  [phone]   If you have any questions prior to your surgery date call [phone]: Open Monday-Friday 8am-4pm If you experience any cold or flu symptoms such as cough, fever, chills, shortness of breath, etc. between now and your scheduled surgery, please notify us at the above number     Remember:  Do not eat or drink after midnight the night before your surgery    Take these medicines the morning of surgery with A SIP OF WATER:  pregabalin (LYRICA)   If Needed:  acetaminophen (TYLENOL) OR HYDROcodone-acetaminophen (NORCO)  carboxymethylcellulose (REFRESH PLUS)   As of today, STOP taking any Aspirin (unless otherwise instructed by your surgeon) Aleve, Naproxen, Ibuprofen, Motrin, Advil, Goody's, BC's, all herbal medications, fish oil, and all vitamins, and Meloxicam (Mobic).            [facility] is not responsible for any belongings or valuables.    Do NOT Smoke (Tobacco/Vaping)  24 hours prior to your procedure  If you use a CPAP at night, you may bring your mask for your overnight stay.   Contacts, glasses, hearing aids, dentures or partials may not be worn into surgery, please bring cases for these belongings   For patients admitted to the hospital, discharge time will be determined by your treatment team.   Patients discharged the day of surgery will not be allowed to drive home, and someone needs to stay with them for 24 hours.   SURGICAL WAITING ROOM VISITATION Patients having surgery or a procedure may have no more than 2 support people in the waiting area - these visitors may rotate.   Children under the age of 40 must have an adult with them who is not the patient. If the patient needs to stay at the hospital during part of their  recovery, the visitor guidelines for inpatient rooms apply. Pre-op nurse will coordinate an appropriate time for 1 support person to accompany patient in pre-op.  This support person may not rotate.   Please refer to [email or web address] for the visitor guidelines for Inpatients (after your surgery is over and you are in a regular room).    Special instructions:    Oral Hygiene is also important to reduce your risk of infection.  Remember - BRUSH YOUR TEETH THE MORNING OF SURGERY WITH YOUR REGULAR TOOTHPASTE     Day of Surgery:  Take a shower with CHG soap. Wear Clean/Comfortable clothing the morning of surgery Do not wear jewelry or makeup. Do not wear lotions, powders, perfumes/cologne or deodorant. Do not shave 48 hours prior to surgery.  Men may shave face and neck. Do not bring valuables to the hospital. Do not wear nail polish, gel polish, artificial nails, or any other type of covering on natural nails (fingers and toes) If you have artificial nails or gel coating that need to be removed by a nail salon, please have this removed prior to surgery. Artificial nails or gel coating may interfere with anesthesia's ability to adequately monitor your vital signs. Remember to brush your teeth WITH YOUR REGULAR TOOTHPASTE.    If you received a COVID test during your pre-op visit, it is requested that you wear a mask when out in public, stay away from anyone that may not be  feeling well, and notify your surgeon if you develop symptoms. If you have been in contact with anyone that has tested positive in the last 10 days, please notify your surgeon.    Please read over the following fact sheets that you were given.

## 2022-12-04 ENCOUNTER — Other Ambulatory Visit: Payer: Self-pay

## 2022-12-04 ENCOUNTER — Encounter (HOSPITAL_COMMUNITY): Payer: Self-pay

## 2022-12-04 ENCOUNTER — Encounter (HOSPITAL_COMMUNITY)
Admission: RE | Admit: 2022-12-04 | Discharge: 2022-12-04 | Disposition: A | Discharge status: Home / Self Care | Payer: BC Managed Care – PPO | Source: Ambulatory Visit | Attending: Neurological Surgery | Admitting: Neurological Surgery

## 2022-12-04 VITALS — BP 103/47 | HR 72 | Temp 97.8°F | Resp 18 | Ht 69.0 in | Wt 207.7 lb

## 2022-12-04 DIAGNOSIS — Z01812 Encounter for preprocedural laboratory examination: Secondary | ICD-10-CM | POA: Diagnosis not present

## 2022-12-04 DIAGNOSIS — M5416 Radiculopathy, lumbar region: Secondary | ICD-10-CM | POA: Diagnosis not present

## 2022-12-04 DIAGNOSIS — Z01818 Encounter for other preprocedural examination: Secondary | ICD-10-CM

## 2022-12-04 HISTORY — DX: Anxiety disorder, unspecified: F41.9

## 2022-12-04 HISTORY — DX: Complex regional pain syndrome i of right lower limb: G90.521

## 2022-12-04 LAB — BASIC METABOLIC PANEL
Anion gap: 6 (ref 5–15)
BUN: 11 mg/dL (ref 6–20)
CO2: 23 mmol/L (ref 22–32)
Calcium: 8.6 mg/dL — ABNORMAL LOW (ref 8.9–10.3)
Chloride: 109 mmol/L (ref 98–111)
Creatinine, Ser: 0.73 mg/dL (ref 0.44–1.00)
GFR, Estimated: >60 mL/min (ref >60)
Glucose, Bld: 89 mg/dL (ref 70–99)
Potassium: 4.1 mmol/L (ref 3.5–5.1)
Sodium: 138 mmol/L (ref 135–145)

## 2022-12-04 LAB — CBC
HCT: 46.7 % — ABNORMAL HIGH (ref 36.0–46.0)
Hemoglobin: 14.9 g/dL (ref 12.0–15.0)
MCH: 30 pg (ref 26.0–34.0)
MCHC: 31.9 g/dL (ref 30.0–36.0)
MCV: 94 fL (ref 80.0–100.0)
Platelets: 132 10*3/uL — ABNORMAL LOW (ref 150–400)
RBC: 4.97 MIL/uL (ref 3.87–5.11)
RDW: 14 % (ref 11.5–15.5)
WBC: 7 10*3/uL (ref 4.0–10.5)
nRBC: 0 % (ref 0.0–0.2)

## 2022-12-04 LAB — TYPE AND SCREEN
ABO/RH(D): A NEG
Antibody Screen: NEGATIVE

## 2022-12-04 LAB — PROTIME-INR
INR: 1.1 (ref 0.8–1.2)
Prothrombin Time: 13.7 seconds (ref 11.4–15.2)

## 2022-12-04 LAB — SURGICAL PCR SCREEN
MRSA, PCR: NEGATIVE
Staphylococcus aureus: NEGATIVE

## 2022-12-04 NOTE — Progress Notes (Signed)
PCP - Verdis Frederickson. Adriana Simas, DO Cardiologist - Denies  PPM/ICD - Denies  Chest x-ray - Denies EKG - Denies Stress Test - Denies ECHO - 06/04/2018 Cardiac Cath - Denies  Sleep Study - Denies  DM: Denies  Blood Thinner Instructions: N/A Aspirin Instructions: N/A  ERAS Protcol - NPO PRE-SURGERY Ensure or G2- N/A  COVID TEST- N/A   Anesthesia review: No  Patient denies shortness of breath, fever, cough and chest pain at PAT appointment   All instructions explained to the patient, with a verbal understanding of the material. Patient agrees to go over the instructions while at home for a better understanding.The opportunity to ask questions was provided.

## 2022-12-10 ENCOUNTER — Encounter (HOSPITAL_COMMUNITY)
Admission: RE | Disposition: A | Discharge status: Home / Self Care | Payer: Self-pay | Source: Home / Self Care | Attending: Neurological Surgery

## 2022-12-10 ENCOUNTER — Ambulatory Visit (HOSPITAL_COMMUNITY): Payer: BC Managed Care – PPO

## 2022-12-10 ENCOUNTER — Encounter (HOSPITAL_COMMUNITY): Payer: Self-pay | Admitting: Neurological Surgery

## 2022-12-10 ENCOUNTER — Other Ambulatory Visit: Payer: Self-pay

## 2022-12-10 ENCOUNTER — Observation Stay (HOSPITAL_COMMUNITY)
Admission: RE | Admit: 2022-12-10 | Discharge: 2022-12-11 | Disposition: A | Discharge status: Home / Self Care | Payer: BC Managed Care – PPO | Attending: Neurological Surgery | Admitting: Neurological Surgery

## 2022-12-10 DIAGNOSIS — M5116 Intervertebral disc disorders with radiculopathy, lumbar region: Secondary | ICD-10-CM | POA: Diagnosis not present

## 2022-12-10 DIAGNOSIS — Z4789 Encounter for other orthopedic aftercare: Secondary | ICD-10-CM | POA: Diagnosis not present

## 2022-12-10 DIAGNOSIS — Z91040 Latex allergy status: Secondary | ICD-10-CM | POA: Diagnosis not present

## 2022-12-10 DIAGNOSIS — F17210 Nicotine dependence, cigarettes, uncomplicated: Secondary | ICD-10-CM | POA: Insufficient documentation

## 2022-12-10 DIAGNOSIS — M48061 Spinal stenosis, lumbar region without neurogenic claudication: Secondary | ICD-10-CM | POA: Diagnosis not present

## 2022-12-10 DIAGNOSIS — Z981 Arthrodesis status: Secondary | ICD-10-CM | POA: Diagnosis not present

## 2022-12-10 HISTORY — DX: Other specified postprocedural states: Z98.890

## 2022-12-10 HISTORY — DX: Nausea with vomiting, unspecified: R11.2

## 2022-12-10 LAB — POCT PREGNANCY, URINE: Preg Test, Ur: NEGATIVE

## 2022-12-10 SURGERY — POSTERIOR LUMBAR FUSION 1 LEVEL
Anesthesia: General | Site: Back

## 2022-12-10 MED ORDER — ACETAMINOPHEN 500 MG PO TABS
1000.0000 mg | ORAL_TABLET | ORAL | Status: AC
  Administered 2022-12-10: 1000 mg via ORAL
  Filled 2022-12-10: qty 2

## 2022-12-10 MED ORDER — MENTHOL 3 MG MT LOZG: 1.0000 | LOZENGE | OROMUCOSAL | Status: DC | PRN

## 2022-12-10 MED ORDER — PROPOFOL 1000 MG/100ML IV EMUL
INTRAVENOUS | Status: AC
  Filled 2022-12-10: qty 200

## 2022-12-10 MED ORDER — ONDANSETRON HCL 4 MG PO TABS: 4.0000 mg | ORAL_TABLET | Freq: Four times a day (QID) | ORAL | Status: DC | PRN

## 2022-12-10 MED ORDER — EPHEDRINE SULFATE-NACL 50-0.9 MG/10ML-% IV SOSY
PREFILLED_SYRINGE | INTRAVENOUS | Status: DC | PRN
  Administered 2022-12-10: 7.5 mg via INTRAVENOUS

## 2022-12-10 MED ORDER — FENTANYL CITRATE (PF) 250 MCG/5ML IJ SOLN
INTRAMUSCULAR | Status: AC
  Filled 2022-12-10: qty 5

## 2022-12-10 MED ORDER — BUPIVACAINE HCL (PF) 0.25 % IJ SOLN
INTRAMUSCULAR | Status: AC
  Filled 2022-12-10: qty 30

## 2022-12-10 MED ORDER — DEXAMETHASONE SODIUM PHOSPHATE 10 MG/ML IJ SOLN
INTRAMUSCULAR | Status: DC | PRN
  Administered 2022-12-10: 10 mg via INTRAVENOUS

## 2022-12-10 MED ORDER — ORAL CARE MOUTH RINSE: 15.0000 mL | Freq: Once | OROMUCOSAL | Status: AC

## 2022-12-10 MED ORDER — ACETAMINOPHEN 500 MG PO TABS
1000.0000 mg | ORAL_TABLET | Freq: Four times a day (QID) | ORAL | Status: AC
  Administered 2022-12-10 – 2022-12-11 (×4): 1000 mg via ORAL
  Filled 2022-12-10 (×4): qty 2

## 2022-12-10 MED ORDER — LIDOCAINE 2% (20 MG/ML) 5 ML SYRINGE
INTRAMUSCULAR | Status: AC
  Filled 2022-12-10: qty 5

## 2022-12-10 MED ORDER — DEXAMETHASONE 4 MG PO TABS
4.0000 mg | ORAL_TABLET | Freq: Four times a day (QID) | ORAL | Status: DC
  Administered 2022-12-10 – 2022-12-11 (×4): 4 mg via ORAL
  Filled 2022-12-10 (×4): qty 1

## 2022-12-10 MED ORDER — ONDANSETRON HCL 4 MG/2ML IJ SOLN: 4.0000 mg | Freq: Four times a day (QID) | INTRAMUSCULAR | Status: DC | PRN

## 2022-12-10 MED ORDER — FENTANYL CITRATE (PF) 100 MCG/2ML IJ SOLN
25.0000 ug | INTRAMUSCULAR | Status: DC | PRN
  Administered 2022-12-10: 50 ug via INTRAVENOUS

## 2022-12-10 MED ORDER — ACETAMINOPHEN 500 MG PO TABS: 1000.0000 mg | ORAL_TABLET | Freq: Once | ORAL | Status: DC | PRN

## 2022-12-10 MED ORDER — OXYCODONE HCL 5 MG/5ML PO SOLN: 5.0000 mg | Freq: Once | ORAL | Status: DC | PRN

## 2022-12-10 MED ORDER — DIPHENHYDRAMINE HCL 50 MG/ML IJ SOLN
INTRAMUSCULAR | Status: AC
  Filled 2022-12-10: qty 1

## 2022-12-10 MED ORDER — MIDAZOLAM HCL 2 MG/2ML IJ SOLN
INTRAMUSCULAR | Status: AC
  Filled 2022-12-10: qty 2

## 2022-12-10 MED ORDER — 0.9 % SODIUM CHLORIDE (POUR BTL) OPTIME
TOPICAL | Status: DC | PRN
  Administered 2022-12-10: 1000 mL

## 2022-12-10 MED ORDER — BUPIVACAINE HCL (PF) 0.25 % IJ SOLN
INTRAMUSCULAR | Status: DC | PRN
  Administered 2022-12-10: 17 mL
  Administered 2022-12-10: 5 mL

## 2022-12-10 MED ORDER — SENNA 8.6 MG PO TABS
1.0000 | ORAL_TABLET | Freq: Two times a day (BID) | ORAL | Status: DC
  Administered 2022-12-10 – 2022-12-11 (×2): 8.6 mg via ORAL
  Filled 2022-12-10 (×2): qty 1

## 2022-12-10 MED ORDER — CEFAZOLIN SODIUM-DEXTROSE 2-4 GM/100ML-% IV SOLN
2.0000 g | INTRAVENOUS | Status: AC
  Administered 2022-12-10: 2 g via INTRAVENOUS
  Filled 2022-12-10: qty 100

## 2022-12-10 MED ORDER — PHENYLEPHRINE HCL-NACL 20-0.9 MG/250ML-% IV SOLN
INTRAVENOUS | Status: AC
  Filled 2022-12-10: qty 500

## 2022-12-10 MED ORDER — MELOXICAM 7.5 MG PO TABS: 15.0000 mg | ORAL_TABLET | Freq: Every day | ORAL | Status: DC

## 2022-12-10 MED ORDER — CEFAZOLIN SODIUM-DEXTROSE 2-4 GM/100ML-% IV SOLN
2.0000 g | Freq: Three times a day (TID) | INTRAVENOUS | Status: AC
  Administered 2022-12-10 (×2): 2 g via INTRAVENOUS
  Filled 2022-12-10 (×2): qty 100

## 2022-12-10 MED ORDER — MIDAZOLAM HCL 2 MG/2ML IJ SOLN
INTRAMUSCULAR | Status: DC | PRN
  Administered 2022-12-10: 2 mg via INTRAVENOUS

## 2022-12-10 MED ORDER — ONDANSETRON HCL 4 MG/2ML IJ SOLN
INTRAMUSCULAR | Status: AC
  Filled 2022-12-10: qty 2

## 2022-12-10 MED ORDER — SURGIPHOR WOUND IRRIGATION SYSTEM - OPTIME
TOPICAL | Status: DC | PRN
  Administered 2022-12-10: 450 mL

## 2022-12-10 MED ORDER — PHENYLEPHRINE 80 MCG/ML (10ML) SYRINGE FOR IV PUSH (FOR BLOOD PRESSURE SUPPORT)
PREFILLED_SYRINGE | INTRAVENOUS | Status: DC | PRN
  Administered 2022-12-10 (×2): 80 ug via INTRAVENOUS

## 2022-12-10 MED ORDER — DOCUSATE SODIUM 100 MG PO CAPS
100.0000 mg | ORAL_CAPSULE | Freq: Two times a day (BID) | ORAL | Status: DC
  Administered 2022-12-10 – 2022-12-11 (×2): 100 mg via ORAL
  Filled 2022-12-10 (×2): qty 1

## 2022-12-10 MED ORDER — PHENYLEPHRINE 80 MCG/ML (10ML) SYRINGE FOR IV PUSH (FOR BLOOD PRESSURE SUPPORT)
PREFILLED_SYRINGE | INTRAVENOUS | Status: AC
  Filled 2022-12-10: qty 10

## 2022-12-10 MED ORDER — SUCCINYLCHOLINE CHLORIDE 200 MG/10ML IV SOSY
PREFILLED_SYRINGE | INTRAVENOUS | Status: AC
  Filled 2022-12-10: qty 10

## 2022-12-10 MED ORDER — SODIUM CHLORIDE 0.9% FLUSH: 3.0000 mL | INTRAVENOUS | Status: DC | PRN

## 2022-12-10 MED ORDER — SUCCINYLCHOLINE 20MG/ML (10ML) SYRINGE FOR MEDFUSION PUMP - OPTIME
INTRAMUSCULAR | Status: DC | PRN
  Administered 2022-12-10: 140 mg via INTRAVENOUS

## 2022-12-10 MED ORDER — ACETAMINOPHEN 10 MG/ML IV SOLN: 1000.0000 mg | Freq: Once | INTRAVENOUS | Status: DC | PRN

## 2022-12-10 MED ORDER — DIPHENHYDRAMINE HCL 50 MG/ML IJ SOLN
INTRAMUSCULAR | Status: DC | PRN
  Administered 2022-12-10: 25 mg via INTRAVENOUS

## 2022-12-10 MED ORDER — THROMBIN 5000 UNITS EX SOLR
CUTANEOUS | Status: AC
  Filled 2022-12-10: qty 5000

## 2022-12-10 MED ORDER — SODIUM CHLORIDE 0.9% FLUSH
3.0000 mL | Freq: Two times a day (BID) | INTRAVENOUS | Status: DC
  Administered 2022-12-10 (×2): 3 mL via INTRAVENOUS

## 2022-12-10 MED ORDER — KETAMINE HCL 10 MG/ML IJ SOLN
INTRAMUSCULAR | Status: DC | PRN
  Administered 2022-12-10: 10 mg via INTRAVENOUS
  Administered 2022-12-10: 30 mg via INTRAVENOUS

## 2022-12-10 MED ORDER — CHLORHEXIDINE GLUCONATE 0.12 % MT SOLN
15.0000 mL | Freq: Once | OROMUCOSAL | Status: AC
  Administered 2022-12-10: 15 mL via OROMUCOSAL
  Filled 2022-12-10: qty 15

## 2022-12-10 MED ORDER — CHLORHEXIDINE GLUCONATE CLOTH 2 % EX PADS: 6.0000 | MEDICATED_PAD | Freq: Once | CUTANEOUS | Status: DC

## 2022-12-10 MED ORDER — ONDANSETRON HCL 4 MG/2ML IJ SOLN
INTRAMUSCULAR | Status: DC | PRN
  Administered 2022-12-10: 4 mg via INTRAVENOUS

## 2022-12-10 MED ORDER — LACTATED RINGERS IV SOLN: INTRAVENOUS | Status: DC

## 2022-12-10 MED ORDER — EPHEDRINE 5 MG/ML INJ
INTRAVENOUS | Status: AC
  Filled 2022-12-10: qty 5

## 2022-12-10 MED ORDER — SODIUM CHLORIDE 0.9 % IV SOLN
250.0000 mL | INTRAVENOUS | Status: DC
  Administered 2022-12-10: 250 mL via INTRAVENOUS

## 2022-12-10 MED ORDER — VASOPRESSIN 20 UNIT/ML IV SOLN
INTRAVENOUS | Status: AC
  Filled 2022-12-10: qty 1

## 2022-12-10 MED ORDER — METHOCARBAMOL 500 MG PO TABS
500.0000 mg | ORAL_TABLET | Freq: Four times a day (QID) | ORAL | Status: DC | PRN
  Administered 2022-12-10 – 2022-12-11 (×3): 500 mg via ORAL
  Filled 2022-12-10 (×3): qty 1

## 2022-12-10 MED ORDER — ROCURONIUM BROMIDE 10 MG/ML (PF) SYRINGE
PREFILLED_SYRINGE | INTRAVENOUS | Status: AC
  Filled 2022-12-10: qty 10

## 2022-12-10 MED ORDER — ACETAMINOPHEN 160 MG/5ML PO SOLN: 1000.0000 mg | Freq: Once | ORAL | Status: DC | PRN

## 2022-12-10 MED ORDER — PROPOFOL 10 MG/ML IV BOLUS
INTRAVENOUS | Status: DC | PRN
  Administered 2022-12-10: 40 mg via INTRAVENOUS
  Administered 2022-12-10: 160 mg via INTRAVENOUS

## 2022-12-10 MED ORDER — THROMBIN 20000 UNITS EX SOLR
CUTANEOUS | Status: DC | PRN
  Administered 2022-12-10: 20 mL via TOPICAL

## 2022-12-10 MED ORDER — DEXAMETHASONE SODIUM PHOSPHATE 4 MG/ML IJ SOLN: 4.0000 mg | Freq: Four times a day (QID) | INTRAMUSCULAR | Status: DC

## 2022-12-10 MED ORDER — SUGAMMADEX SODIUM 200 MG/2ML IV SOLN
INTRAVENOUS | Status: DC | PRN
  Administered 2022-12-10 (×2): 200 mg via INTRAVENOUS

## 2022-12-10 MED ORDER — PREGABALIN 75 MG PO CAPS
150.0000 mg | ORAL_CAPSULE | Freq: Three times a day (TID) | ORAL | Status: DC
  Administered 2022-12-10 – 2022-12-11 (×3): 150 mg via ORAL
  Filled 2022-12-10 (×3): qty 2

## 2022-12-10 MED ORDER — PROPOFOL 10 MG/ML IV BOLUS
INTRAVENOUS | Status: AC
  Filled 2022-12-10: qty 20

## 2022-12-10 MED ORDER — ROCURONIUM BROMIDE 10 MG/ML (PF) SYRINGE
PREFILLED_SYRINGE | INTRAVENOUS | Status: DC | PRN
  Administered 2022-12-10: 60 mg via INTRAVENOUS

## 2022-12-10 MED ORDER — METHOCARBAMOL 1000 MG/10ML IJ SOLN: 500.0000 mg | Freq: Four times a day (QID) | INTRAVENOUS | Status: DC | PRN

## 2022-12-10 MED ORDER — THROMBIN 20000 UNITS EX SOLR
CUTANEOUS | Status: AC
  Filled 2022-12-10: qty 20000

## 2022-12-10 MED ORDER — THROMBIN 5000 UNITS EX SOLR
OROMUCOSAL | Status: DC | PRN
  Administered 2022-12-10: 5 mL via TOPICAL

## 2022-12-10 MED ORDER — GABAPENTIN 300 MG PO CAPS
300.0000 mg | ORAL_CAPSULE | ORAL | Status: AC
  Administered 2022-12-10: 300 mg via ORAL
  Filled 2022-12-10: qty 1

## 2022-12-10 MED ORDER — OXYCODONE HCL 5 MG PO TABS: 5.0000 mg | ORAL_TABLET | Freq: Once | ORAL | Status: DC | PRN

## 2022-12-10 MED ORDER — POTASSIUM CHLORIDE IN NACL 20-0.9 MEQ/L-% IV SOLN: INTRAVENOUS | Status: DC

## 2022-12-10 MED ORDER — FENTANYL CITRATE (PF) 100 MCG/2ML IJ SOLN
INTRAMUSCULAR | Status: AC
  Filled 2022-12-10: qty 2

## 2022-12-10 MED ORDER — KETAMINE HCL 50 MG/5ML IJ SOSY
PREFILLED_SYRINGE | INTRAMUSCULAR | Status: AC
  Filled 2022-12-10: qty 10

## 2022-12-10 MED ORDER — OXYCODONE HCL 5 MG PO TABS
10.0000 mg | ORAL_TABLET | ORAL | Status: DC | PRN
  Administered 2022-12-10 – 2022-12-11 (×7): 10 mg via ORAL
  Filled 2022-12-10 (×7): qty 2

## 2022-12-10 MED ORDER — PHENYLEPHRINE HCL-NACL 20-0.9 MG/250ML-% IV SOLN
INTRAVENOUS | Status: DC | PRN
  Administered 2022-12-10: 20 ug/min via INTRAVENOUS

## 2022-12-10 MED ORDER — PHENOL 1.4 % MT LIQD: 1.0000 | OROMUCOSAL | Status: DC | PRN

## 2022-12-10 MED ORDER — FENTANYL CITRATE (PF) 250 MCG/5ML IJ SOLN
INTRAMUSCULAR | Status: DC | PRN
  Administered 2022-12-10: 100 ug via INTRAVENOUS
  Administered 2022-12-10: 50 ug via INTRAVENOUS

## 2022-12-10 MED ORDER — HYDROMORPHONE HCL 1 MG/ML IJ SOLN: 0.5000 mg | INTRAMUSCULAR | Status: DC | PRN

## 2022-12-10 MED ORDER — DEXAMETHASONE SODIUM PHOSPHATE 10 MG/ML IJ SOLN
INTRAMUSCULAR | Status: AC
  Filled 2022-12-10: qty 1

## 2022-12-10 MED ORDER — CELECOXIB 200 MG PO CAPS
200.0000 mg | ORAL_CAPSULE | Freq: Two times a day (BID) | ORAL | Status: DC
  Administered 2022-12-10: 200 mg via ORAL
  Filled 2022-12-10: qty 1

## 2022-12-10 MED ORDER — LIDOCAINE HCL (CARDIAC) PF 100 MG/5ML IV SOSY
PREFILLED_SYRINGE | INTRAVENOUS | Status: DC | PRN
  Administered 2022-12-10: 80 mg via INTRATRACHEAL

## 2022-12-10 SURGICAL SUPPLY — 69 items
ADH SKN CLS APL DERMABOND .7 (GAUZE/BANDAGES/DRESSINGS) ×1
APL SKNCLS STERI-STRIP NONHPOA (GAUZE/BANDAGES/DRESSINGS) ×1
BAG COUNTER SPONGE SURGICOUNT (BAG) ×1 IMPLANT
BAG SPNG CNTER NS LX DISP (BAG) ×1
BASKET BONE COLLECTION (BASKET) ×1 IMPLANT
BENZOIN TINCTURE PRP APPL 2/3 (GAUZE/BANDAGES/DRESSINGS) ×1 IMPLANT
BLADE BONE MILL MEDIUM (MISCELLANEOUS) ×1 IMPLANT
BLADE CLIPPER SURG (BLADE) IMPLANT
BUR CARBIDE MATCH 3.0 (BURR) ×1 IMPLANT
CANISTER SUCT 3000ML PPV (MISCELLANEOUS) ×1 IMPLANT
CATH FOLEY LATEX FREE 16FR (CATHETERS) ×1
CATH FOLEY LF 16FR (CATHETERS) IMPLANT
CNTNR URN SCR LID CUP LEK RST (MISCELLANEOUS) ×1 IMPLANT
CONT SPEC 4OZ STRL OR WHT (MISCELLANEOUS) ×1
COVER BACK TABLE 60X90IN (DRAPES) ×1 IMPLANT
DERMABOND ADVANCED .7 DNX12 (GAUZE/BANDAGES/DRESSINGS) ×1 IMPLANT
DRAPE C-ARM 42X72 X-RAY (DRAPES) ×2 IMPLANT
DRAPE LAPAROTOMY 100X72X124 (DRAPES) ×1 IMPLANT
DRAPE SURG 17X23 STRL (DRAPES) ×1 IMPLANT
DRSG OPSITE POSTOP 4X6 (GAUZE/BANDAGES/DRESSINGS) IMPLANT
DURAPREP 26ML APPLICATOR (WOUND CARE) ×1 IMPLANT
ELECT REM PT RETURN 9FT ADLT (ELECTROSURGICAL) ×1
ELECTRODE REM PT RTRN 9FT ADLT (ELECTROSURGICAL) ×1 IMPLANT
EVACUATOR 1/8 PVC DRAIN (DRAIN) ×1 IMPLANT
GAUZE 4X4 16PLY ~~LOC~~+RFID DBL (SPONGE) IMPLANT
GLOVE BIO SURGEON STRL SZ7 (GLOVE) IMPLANT
GLOVE BIO SURGEON STRL SZ8 (GLOVE) ×2 IMPLANT
GLOVE BIOGEL PI IND STRL 7.0 (GLOVE) IMPLANT
GLOVE SURG SS PI 7.0 STRL IVOR (GLOVE) IMPLANT
GLOVE SURG SS PI 8.0 STRL IVOR (GLOVE) IMPLANT
GOWN STRL REUS W/ TWL LRG LVL3 (GOWN DISPOSABLE) IMPLANT
GOWN STRL REUS W/ TWL XL LVL3 (GOWN DISPOSABLE) ×2 IMPLANT
GOWN STRL REUS W/TWL 2XL LVL3 (GOWN DISPOSABLE) IMPLANT
GOWN STRL REUS W/TWL LRG LVL3 (GOWN DISPOSABLE)
GOWN STRL REUS W/TWL XL LVL3 (GOWN DISPOSABLE) ×2
GRAFT BONE PROTEIOS MED 2.5CC (Orthopedic Implant) IMPLANT
HEMOSTAT POWDER KIT SURGIFOAM (HEMOSTASIS) ×1 IMPLANT
KIT BASIN OR (CUSTOM PROCEDURE TRAY) ×1 IMPLANT
KIT GRAFTMAG DEL NEURO DISP (NEUROSURGERY SUPPLIES) IMPLANT
KIT TURNOVER KIT B (KITS) ×1 IMPLANT
MATRIX SPINE STRIP NEOCORE 5CC (Putty) IMPLANT
MILL BONE PREP (MISCELLANEOUS) ×1 IMPLANT
NDL HYPO 25X1 1.5 SAFETY (NEEDLE) ×1 IMPLANT
NEEDLE HYPO 25X1 1.5 SAFETY (NEEDLE) ×1 IMPLANT
NS IRRIG 1000ML POUR BTL (IV SOLUTION) ×1 IMPLANT
PACK LAMINECTOMY NEURO (CUSTOM PROCEDURE TRAY) ×1 IMPLANT
PAD ARMBOARD 7.5X6 YLW CONV (MISCELLANEOUS) ×3 IMPLANT
PUTTY DBM 5CC (Putty) IMPLANT
ROD LORD LIPPED TI 5.5X35 (Rod) IMPLANT
SCREW CORT SHANK MOD 6.5X40 (Screw) IMPLANT
SCREW KODIAK 6.5X45 (Screw) IMPLANT
SCREW POLYAXIAL TULIP (Screw) IMPLANT
SET SCREW (Screw) ×4 IMPLANT
SET SCREW SPNE (Screw) IMPLANT
SOL ELECTROSURG ANTI STICK (MISCELLANEOUS) ×1
SOLUTION ELECTROSURG ANTI STCK (MISCELLANEOUS) ×1 IMPLANT
SOLUTION IRRIG SURGIPHOR (IV SOLUTION) IMPLANT
SPACER IDENTITI 9X9X25 5D (Spacer) IMPLANT
SPONGE SURGIFOAM ABS GEL 100 (HEMOSTASIS) ×1 IMPLANT
SPONGE T-LAP 4X18 ~~LOC~~+RFID (SPONGE) IMPLANT
STRIP CLOSURE SKIN 1/2X4 (GAUZE/BANDAGES/DRESSINGS) ×2 IMPLANT
SUT VIC AB 0 CT1 18XCR BRD8 (SUTURE) ×1 IMPLANT
SUT VIC AB 0 CT1 8-18 (SUTURE) ×1
SUT VIC AB 2-0 CP2 18 (SUTURE) ×1 IMPLANT
SUT VIC AB 3-0 SH 8-18 (SUTURE) ×2 IMPLANT
TOWEL GREEN STERILE (TOWEL DISPOSABLE) ×1 IMPLANT
TOWEL GREEN STERILE FF (TOWEL DISPOSABLE) ×1 IMPLANT
TRAY FOLEY MTR SLVR 16FR STAT (SET/KITS/TRAYS/PACK) ×1 IMPLANT
WATER STERILE IRR 1000ML POUR (IV SOLUTION) ×1 IMPLANT

## 2022-12-10 NOTE — Op Note (Signed)
12/10/2022  11:09 AM  PATIENT:  Emma Johnson  47 y.o. female  PRE-OPERATIVE DIAGNOSIS: Recurrent right L3-4 extraforaminal disc protrusion with right L3 radiculopathy  POST-OPERATIVE DIAGNOSIS:  same  PROCEDURE:   1. Decompressive lumbar laminectomy, hemi facetectomy and foraminotomies L3-4 requiring more work than would be required for a simple exposure of the disk for PLIF in order to adequately decompress the neural elements and address the spinal stenosis 2. Posterior lumbar interbody fusion L3-4 using PTI interbody cages packed with morcellized allograft and autograft  3. Posterior fixation L3-4 using ATEC cortical pedicle screws.  4. Intertransverse arthrodesis L3-4 using morcellized autograft and allograft.  SURGEON:  Marikay Alar, MD  ASSISTANTS: Verlin Dike, FNP  ANESTHESIA:  General  EBL: 100 ml  Total I/O In: 100 [IV Piggyback:100] Out: 350 [Urine:250; Blood:100]  BLOOD ADMINISTERED:none  DRAINS: none   INDICATION FOR PROCEDURE: This patient presented with recurrent right L3 radicular pain.  Had 2 previous right L3-4 extraforaminal microdiscectomies.  Imaging revealed large recurrent disc herniation. The patient tried a reasonable attempt at conservative medical measures without relief. I recommended decompression and instrumented fusion to address the stenosis as well as the segmental  instability.  Patient understood the risks, benefits, and alternatives and potential outcomes and wished to proceed.  PROCEDURE DETAILS:  The patient was brought to the operating room. After induction of generalized endotracheal anesthesia the patient was rolled into the prone position on chest rolls and all pressure points were padded. The patient's lumbar region was cleaned and then prepped with DuraPrep and draped in the usual sterile fashion. Anesthesia was injected and then a dorsal midline incision was made and carried down to the lumbosacral fascia. The fascia was opened  and the paraspinous musculature was taken down in a subperiosteal fashion to expose L3-4 bilaterally. A self-retaining retractor was placed. Intraoperative fluoroscopy confirmed my level, and I started with placement of the L3 cortical pedicle screws. The pedicle screw entry zones were identified utilizing surface landmarks and  AP and lateral fluoroscopy. I scored the cortex with the high-speed drill and then used the hand drill to drill an upward and outward direction into the pedicle. I then tapped line to line. I then placed a 6.5 x 40 mm cortical pedicle screw into the pedicles of L3 bilaterally.    I then turned my attention to the decompression and complete lumbar laminectomies, hemi- facetectomies, and foraminotomies were performed at L3-4.  My nurse practitioner was directly involved in the decompression and exposure of the neural elements. the patient had significant spinal stenosis and this required more work than would be required for a simple exposure of the disc for posterior lumbar interbody fusion which would only require a limited laminotomy. Much more generous decompression and generous foraminotomy was undertaken in order to adequately decompress the neural elements and address the patient's leg pain. The yellow ligament was removed to expose the underlying dura and nerve roots, and generous foraminotomies were performed to adequately decompress the neural elements. Both the exiting and traversing nerve roots were decompressed on both sides until a coronary dilator passed easily along the nerve roots.  A massive recurrent disc herniation was identified at L3-4 on the right with a large free fragment extending superiorly over the L3 nerve root.  It was encapsulated so I incised the capsule and then removed several large fragments of disc.  This completely decompressed the L3 nerve root.  Once the decompression was complete, I turned my attention to the posterior lower  lumbar interbody fusion. The  epidural venous vasculature was coagulated and cut sharply. Disc space was incised and the initial discectomy was performed with pituitary rongeurs. The disc space was distracted with sequential distractors to a height of 9 mm. We then used a series of scrapers and shavers to prepare the endplates for fusion. The midline was prepared with Epstein curettes. Once the complete discectomy was finished, we packed an appropriate sized interbody cage with local autograft and morcellized allograft, gently retracted the nerve root, and tapped the cage into position at L3-4.  The midline between the cages was packed with morselized autograft and allograft.   We then turned our attention to the placement of the lower pedicle screws. The pedicle screw entry zones were identified utilizing surface landmarks and fluoroscopy. I drilled into each pedicle utilizing the hand drill, and tapped each pedicle with the appropriate tap. We palpated with a ball probe to assure no break in the cortex. We then placed 6.5 x 45 mm pedicle screws into the pedicles bilaterally at L4.  My nurse practitioner assisted in placement of the pedicle screws.  We then decorticated the transverse processes and laid a mixture of morcellized autograft and allograft out over these to perform intertransverse arthrodesis at L3-4. We then placed lordotic rods into the multiaxial screw heads of the pedicle screws and locked these in position with the locking caps and anti-torque device while achieving compression of her grafts. We then checked our construct with AP and lateral fluoroscopy. Irrigated with copious amounts of surgeriphor 0.5% iodine solution followed by saline solution. Inspected the nerve roots once again to assure adequate decompression, lined to the dura with Gelfoam,  and then we closed the muscle and the fascia with 0 Vicryl. Closed the subcutaneous tissues with 2-0 Vicryl and subcuticular tissues with 3-0 Vicryl. The skin was closed with  benzoin and Steri-Strips. Dressing was then applied, the patient was awakened from general anesthesia and transported to the recovery room in stable condition. At the end of the procedure all sponge, needle and instrument counts were correct.   PLAN OF CARE: admit to inpatient  PATIENT DISPOSITION:  PACU - hemodynamically stable.   Delay start of Pharmacological VTE agent (>24hrs) due to surgical blood loss or risk of bleeding:  yes

## 2022-12-10 NOTE — Transfer of Care (Signed)
Immediate Anesthesia Transfer of Care Note  Patient: Emma Johnson  Procedure(s) Performed: Posterior Lumbar Interbody Fusion - Lumbar three-Lumbar four (Back)  Patient Location: PACU  Anesthesia Type:General  Level of Consciousness: sedated and drowsy  Airway & Oxygen Therapy: Patient Spontanous Breathing and Patient connected to face mask oxygen  Post-op Assessment: Report given to RN and Post -op Vital signs reviewed and stable  Post vital signs: Reviewed  Last Vitals:  Vitals Value Taken Time  BP 114/49 12/10/22 1120  Temp    Pulse 79 12/10/22 1129  Resp 20 12/10/22 1129  SpO2 94 % 12/10/22 1129  Vitals shown include unvalidated device data.  Last Pain:  Vitals:   12/10/22 0722  PainSc: 0-No pain      Patients Stated Pain Goal: 2 (12/10/22 1610)  Complications: No notable events documented.

## 2022-12-10 NOTE — H&P (Signed)
Subjective: Patient is a 47 y.o. 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 Onset of symptoms was several months ago, gradually worsening since that time.  The pain is rated intense, and is located at the across the lower back and radiates to RLE. 2 previous extraforaminal diskectomies at this level, this is er 3rd recurrence. The pain is described as burning, stabbing, and throbbing and occurs all day. The symptoms have been progressive. Symptoms are exacerbated by exercise and standing. 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   Past Medical History:  Diagnosis Date   AMA (advanced maternal age) multigravida 35+    Anxiety    Complex regional pain syndrome i of right lower limb    Depression    Fibroids    No pertinent past medical history    PONV (postoperative nausea and vomiting)     Past Surgical History:  Procedure Laterality Date   DILATION AND CURETTAGE OF UTERUS     DILITATION & CURRETTAGE/HYSTROSCOPY WITH THERMACHOICE ABLATION N/A 05/18/2013   Procedure: DILATATION & CURETTAGE/HYSTEROSCOPY WITH THERMACHOICE ABLATION (Total Therapy Time=5minutes);  Surgeon: Lazaro Arms, MD;  Location: [facility];  Service: Gynecology;  Laterality: N/A;   LAMINECTOMY AND MICRODISCECTOMY LUMBAR SPINE Right 10/30/2020   LUMBAR LAMINECTOMY/ DECOMPRESSION WITH MET-RX N/A 03/19/2022   Procedure: RIGHT MINIMALLY INVASIVE LUMBAR THREE-FOUR DISCECTOMY;  Surgeon: Jadene Pierini, MD;  Location: [facility];  Service: Neurosurgery;  Laterality: N/A;   POLYPECTOMY N/A 05/18/2013   Procedure: ENDOMETRIAL POLYPECTOMY;  Surgeon: Lazaro Arms, MD;  Location: [facility];  Service: Gynecology;  Laterality: N/A;    Prior to Admission medications   Medication Sig Start Date End Date Taking? Authorizing Provider  acetaminophen (TYLENOL) 500 MG tablet Take 1,000 mg by mouth every 6 (six) hours as needed for moderate pain.   Yes [provider]  Biotin w/ Vitamins C & E (HAIR SKIN & NAILS GUMMIES PO) Take 2 capsules by  mouth daily.   Yes [provider]  carboxymethylcellulose (REFRESH PLUS) 0.5 % SOLN Place 1 drop into both eyes 3 (three) times daily as needed (dry eyes).   Yes [provider]  Cyanocobalamin (B-12 PO) Take 1 tablet by mouth daily.   Yes [provider]  HYDROcodone-acetaminophen (NORCO) 10-325 MG tablet Take 1 tablet by mouth 2 (two) times daily as needed for moderate pain or severe pain.   Yes [provider]  meloxicam (MOBIC) 15 MG tablet Take 15 mg by mouth daily. 02/25/22  Yes [provider]  pregabalin (LYRICA) 150 MG capsule Take 150 mg by mouth 3 (three) times daily. 02/03/22  Yes [provider]   Allergies  Allergen Reactions   Bee Venom Swelling and Rash   Latex Rash   Onion     Blisters in mouth and bloating    Social History   Tobacco Use   Smoking status: Every Day    Packs/day: 1.00    Years: 20.00    Additional pack years: 0.00    Total pack years: 20.00    Types: Cigarettes   Smokeless tobacco: Never   Tobacco comments:    trying to quit  Substance Use Topics   Alcohol use: No    Family History  Problem Relation Age of Onset   Hypertension Father    Multiple sclerosis Mother    Cancer Maternal Aunt        uterine and lung   Cancer Paternal Uncle        leukemia  Cancer Maternal Grandmother        breast   Heart disease Paternal Grandmother      Review of Systems  Positive ROS: neg  All other systems have been reviewed and were otherwise negative with the exception of those mentioned in the HPI and as above.  Objective: Vital signs in last 24 hours: Temp:  [97.7 F (36.5 C)] 97.7 F (36.5 C) (05/01 0658) Pulse Rate:  [79] 79 (05/01 0658) Resp:  [18] 18 (05/01 0658) BP: (107)/(67) 107/67 (05/01 0658) SpO2:  [97 %] 97 % (05/01 0658) Weight:  [93.9 kg] 93.9 kg (05/01 0658)  General Appearance: Alert, cooperative, no distress, appears stated age Head: Normocephalic, without obvious  abnormality, atraumatic Eyes: PERRL, conjunctiva/corneas clear, EOM's intact    Neck: Supple, symmetrical, trachea midline Back: Symmetric, no curvature, ROM normal, no CVA tenderness Lungs:  respirations unlabored Heart: Regular rate and rhythm Abdomen: Soft, non-tender Extremities: Extremities normal, atraumatic, no cyanosis or edema Pulses: 2+ and symmetric all extremities Skin: Skin color, texture, turgor normal, no rashes or lesions  NEUROLOGIC:   Mental status: Alert and oriented x4,  no aphasia, good attention span, fund of knowledge, and memory Motor Exam - grossly normal Sensory Exam - grossly normal Reflexes: 1+ Coordination - grossly normal Gait - grossly normal Balance - grossly normal Cranial Nerves: I: smell Not tested  II: visual acuity  OS: nl    OD: nl  II: visual fields Full to confrontation  II: pupils Equal, round, reactive to light  III,VII: ptosis None  III,IV,VI: extraocular muscles  Full ROM  V: mastication Normal  V: facial light touch sensation  Normal  V,VII: corneal reflex  Present  VII: facial muscle function - upper  Normal  VII: facial muscle function - lower Normal  VIII: hearing Not tested  IX: soft palate elevation  Normal  IX,X: gag reflex Present  XI: trapezius strength  5/5  XI: sternocleidomastoid strength 5/5  XI: neck flexion strength  5/5  XII: tongue strength  Normal    Data Review Lab Results  Component Value Date   WBC 7.0 12/04/2022   HGB 14.9 12/04/2022   HCT 46.7 (H) 12/04/2022   MCV 94.0 12/04/2022   PLT 132 (L) 12/04/2022   Lab Results  Component Value Date   NA 138 12/04/2022   K 4.1 12/04/2022   CL 109 12/04/2022   CO2 23 12/04/2022   BUN 11 12/04/2022   CREATININE 0.73 12/04/2022   GLUCOSE 89 12/04/2022   Lab Results  Component Value Date   INR 1.1 12/04/2022    Assessment/Plan:  Estimated body mass index is 30.57 kg/m as calculated from the following:   Height as of this encounter: 5\' 9"  (1.753  m).   Weight as of this encounter: 93.9 kg. Patient admitted for PLIF L3-4. Patient has failed a reasonable attempt at conservative therapy.  I explained the condition and procedure to the patient and answered any questions.  Patient wishes to proceed with procedure as planned. Understands risks/ benefits and typical outcomes of procedure.   Tia Alert 12/10/2022 8:06 AM

## 2022-12-10 NOTE — Anesthesia Preprocedure Evaluation (Signed)
Anesthesia Evaluation   Patient awake    Reviewed: Allergy & Precautions, NPO status , Patient's Chart, lab work & pertinent test results  History of Anesthesia Complications (+) PONV and history of anesthetic complications  Airway Mallampati: II  TM Distance: >3 FB Neck ROM: Full    Dental  (+) Dental Advisory Given, Teeth Intact   Pulmonary neg shortness of breath, neg COPD, neg recent URI, Current SmokerPatient did not abstain from smoking.   breath sounds clear to auscultation       Cardiovascular negative cardio ROS  Rhythm:Regular     Neuro/Psych  PSYCHIATRIC DISORDERS Anxiety Depression     Neuromuscular disease    GI/Hepatic negative GI ROS, Neg liver ROS,,,  Endo/Other  negative endocrine ROS    Renal/GU negative Renal ROS     Musculoskeletal negative musculoskeletal ROS (+)    Abdominal   Peds  Hematology negative hematology ROS (+) Lab Results      Component                Value               Date                      WBC                      7.0                 12/04/2022                HGB                      14.9                12/04/2022                HCT                      46.7 (H)            12/04/2022                MCV                      94.0                12/04/2022                PLT                      132 (L)             12/04/2022              Anesthesia Other Findings   Reproductive/Obstetrics                             Anesthesia Physical Anesthesia Plan  ASA: 2  Anesthesia Plan: General   Post-op Pain Management: Tylenol PO (pre-op)* and Gabapentin PO (pre-op)*   Induction: Intravenous  PONV Risk Score and Plan: 3 and Ondansetron and Dexamethasone  Airway Management Planned: Oral ETT  Additional Equipment: None  Intra-op Plan:   Post-operative Plan: Extubation in OR  Informed Consent: I have reviewed the patients History and Physical,  chart, labs and discussed the procedure including the risks, benefits and alternatives for the  proposed anesthesia with the patient or authorized representative who has indicated his/her understanding and acceptance.     Dental advisory given  Plan Discussed with: CRNA  Anesthesia Plan Comments:        Anesthesia Quick Evaluation

## 2022-12-10 NOTE — Anesthesia Procedure Notes (Signed)
Procedure Name: Intubation Date/Time: 12/10/2022 9:04 AM  Performed by: Darlina Guys, CRNAPre-anesthesia Checklist: Emergency Drugs available, Suction available, Patient being monitored, Timeout performed and Patient identified Patient Re-evaluated:Patient Re-evaluated prior to induction Oxygen Delivery Method: Circle system utilized Preoxygenation: Pre-oxygenation with 100% oxygen Induction Type: IV induction Laryngoscope Size: Glidescope and 3 Grade View: Grade I Tube type: Oral

## 2022-12-11 DIAGNOSIS — F17210 Nicotine dependence, cigarettes, uncomplicated: Secondary | ICD-10-CM | POA: Diagnosis not present

## 2022-12-11 DIAGNOSIS — Z91040 Latex allergy status: Secondary | ICD-10-CM | POA: Diagnosis not present

## 2022-12-11 DIAGNOSIS — M5116 Intervertebral disc disorders with radiculopathy, lumbar region: Secondary | ICD-10-CM | POA: Diagnosis not present

## 2022-12-11 MED ORDER — OXYCODONE HCL 10 MG PO TABS: 10.0000 mg | ORAL_TABLET | ORAL | 0 refills | Status: DC | PRN

## 2022-12-11 NOTE — Evaluation (Signed)
Physical Therapy Evaluation and Discharge  Patient Details Name: Emma Johnson MRN: [id number] DOB: 1976-01-28 Today's Date: 12/11/2022  History of Present Illness  Pt is a 47 y/o female presenting on 5/1 for same day PLIF L 3-4. PMHx: discectomy 2022, prior lumbar sx 2023, anxiety, fibroids.   Clinical Impression  Patient evaluated by Physical Therapy with no further acute PT needs identified. All education has been completed and the patient has no further questions. Pt was able to demonstrate transfers and ambulation with gross modified independence and RW for support. Pt was educated on precautions, brace application/wearing schedule, appropriate activity progression, and car transfer. See below for any follow-up Physical Therapy or equipment needs. PT is signing off. Thank you for this referral.      Recommendations for follow up therapy are one component of a multi-disciplinary discharge planning process, led by the attending physician.  Recommendations may be updated based on patient status, additional functional criteria and insurance authorization.  Follow Up Recommendations       Assistance Recommended at Discharge PRN  Patient can return home with the following  Assistance with cooking/housework;Assist for transportation    Equipment Recommendations None recommended by PT  Recommendations for Other Services       Functional Status Assessment Patient has had a recent decline in their functional status and demonstrates the ability to make significant improvements in function in a reasonable and predictable amount of time.     Precautions / Restrictions Precautions Precautions: Back Precaution Booklet Issued: Yes (comment) Precaution Comments: reviewed with pt Required Braces or Orthoses: Spinal Brace Spinal Brace: Lumbar corset Restrictions Weight Bearing Restrictions: No      Mobility  Bed Mobility               General bed mobility comments: OOB upon  entry    Transfers Overall transfer level: Modified independent Equipment used: None                    Ambulation/Gait Ambulation/Gait assistance: Modified independent (Device/Increase time) Gait Distance (Feet): 500 Feet Assistive device: Rolling walker (2 wheels) Gait Pattern/deviations: Step-through pattern, Decreased stride length, Drifts right/left Gait velocity: Decreased Gait velocity interpretation: 1.31 - 2.62 ft/sec, indicative of limited community ambulator   General Gait Details: Pt ambulating well with RW for support. No overt LOB noted. Pt preferred to use RW but did not seem to require it for balance.  Stairs Stairs: Yes Stairs assistance: Supervision Stair Management: One rail Right, Step to pattern, Forwards Number of Stairs: 10 General stair comments: VC's for sequencing and general safety.  Wheelchair Mobility    Modified Rankin (Stroke Patients Only)       Balance Overall balance assessment: Mild deficits observed, not formally tested                                           Pertinent Vitals/Pain Pain Assessment Pain Assessment: Faces Faces Pain Scale: Hurts little more Pain Location: back- incisional Pain Descriptors / Indicators: Discomfort Pain Intervention(s): Limited activity within patient's tolerance, Monitored during session, Repositioned    Home Living Family/patient expects to be discharged to:: Private residence Living Arrangements: Spouse/significant other;Children Available Help at Discharge: Family;Available 24 hours/day Type of Home: House Home Access: Stairs to enter Entrance Stairs-Rails: Can reach both Entrance Stairs-Number of Steps: 4-5   Home Layout: One level Home Equipment: Agricultural consultant (2  wheels);Cane - single point;BSC/3in1;Adaptive equipment;Wheelchair Financial trader (4 wheels) Additional Comments: Orvilla Fus is fiance    Prior Function Prior Level of Function : Independent/Modified  Independent             Mobility Comments: no AD ADLs Comments: works as a Engineer, 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   Dominant Hand: Right    Extremity/Trunk Assessment   Upper Extremity Assessment Upper Extremity Assessment: Defer to OT evaluation    Lower Extremity Assessment Lower Extremity Assessment: RLE deficits/detail RLE Deficits / Details: Decreased strength and muscular endurance consistent with pre-op diagnosis    Cervical / Trunk Assessment Cervical / Trunk Assessment: Back Surgery  Communication   Communication: No difficulties  Cognition Arousal/Alertness: Awake/alert Behavior During Therapy: WFL for tasks assessed/performed Overall Cognitive Status: Within Functional Limits for tasks assessed                                          General Comments General comments (skin integrity, edema, etc.): pt with good understanding of back precautions, reviewed compensatory techniques, activity progression, IADLs, and DME.    Exercises     Assessment/Plan    PT Assessment Patient does not need any further PT services  PT Problem List         PT Treatment Interventions      PT Goals (Current goals can be found in the Care Plan section)  Acute Rehab PT Goals Patient Stated Goal: Home today PT Goal Formulation: All assessment and education complete, DC therapy    Frequency       Co-evaluation               AM-PAC PT "6 Clicks" Mobility  Outcome Measure Help needed turning from your back to your side while in a flat bed without using bedrails?: None Help needed moving from lying on your back to sitting on the side of a flat bed without using bedrails?: None Help needed moving to and from a bed to a chair (including a wheelchair)?: None Help needed standing up from a chair using your arms (e.g., wheelchair or bedside chair)?: None Help needed to walk in hospital room?: None Help needed climbing 3-5 steps with a railing? :  A Little 6 Click Score: 23    End of Session Equipment Utilized During Treatment: Gait belt;Back brace Activity Tolerance: Patient tolerated treatment well Patient left:  (Standing in room) Nurse Communication: Mobility status PT Visit Diagnosis: Unsteadiness on feet (R26.81);Pain Pain - part of body:  (back)    Time: 1478-2956 PT Time Calculation (min) (ACUTE ONLY): 18 min   Charges:   PT Evaluation $PT Eval Low Complexity: 1 Low          Conni Slipper, PT, DPT Acute Rehabilitation Services Secure Chat Preferred Office: [phone]   Marylynn Pearson 12/11/2022, 9:43 AM

## 2022-12-11 NOTE — Anesthesia Postprocedure Evaluation (Signed)
Anesthesia Post Note  Patient: Emma Johnson  Procedure(s) Performed: Posterior Lumbar Interbody Fusion - Lumbar three-Lumbar four (Back)     Patient location during evaluation: PACU Anesthesia Type: General Level of consciousness: awake and alert Pain management: pain level controlled Vital Signs Assessment: post-procedure vital signs reviewed and stable Respiratory status: spontaneous breathing, nonlabored ventilation and respiratory function stable Cardiovascular status: blood pressure returned to baseline and stable Postop Assessment: no apparent nausea or vomiting Anesthetic complications: no   No notable events documented.  Last Vitals:  Vitals:   12/11/22 0415 12/11/22 0815  BP: (!) 120/55 (!) 115/57  Pulse: 68 64  Resp: 18 18  Temp: 36.9 C 36.6 C  SpO2: 96% 98%    Last Pain:  Vitals:   12/11/22 0904  TempSrc:   PainSc: 7                  Jerrit Horen

## 2022-12-11 NOTE — Evaluation (Signed)
Occupational Therapy Evaluation Patient Details Name: Emma Johnson MRN: [id number] DOB: November 26, 1975 Today's Date: 12/11/2022   History of Present Illness Pt is a 47 y/o female presenting on 5/1 for same day PLIF L 3-4. PMHx: discectomy 2022, prior lumbar sx 2023, anxiety, fibroids.   Clinical Impression   PTA patient independent and working. Admitted for above and presents with problem list below. Educated on back precautions, safety, brace mgmt and wear schedule, Adl compensatory techniques, DME and recommendations.  She is able to complete ADLs and functional mobility/transfers in room with modified independence, will have support of significant other as needed. Based on performance today, no further OT needs have been identified.  Pt with no further questions or concerns. OT will sign off.      Recommendations for follow up therapy are one component of a multi-disciplinary discharge planning process, led by the attending physician.  Recommendations may be updated based on patient status, additional functional criteria and insurance authorization.   Assistance Recommended at Discharge PRN  Patient can return home with the following      Functional Status Assessment     Equipment Recommendations  None recommended by OT    Recommendations for Other Services       Precautions / Restrictions Precautions Precautions: Back Precaution Booklet Issued: Yes (comment) Precaution Comments: reviewed with pt Required Braces or Orthoses: Spinal Brace Spinal Brace: Lumbar corset Restrictions Weight Bearing Restrictions: No      Mobility Bed Mobility               General bed mobility comments: OOB upon entry    Transfers Overall transfer level: Modified independent                        Balance Overall balance assessment: No apparent balance deficits (not formally assessed)                                         ADL either performed or  assessed with clinical judgement   ADL Overall ADL's : Modified independent                                       General ADL Comments: pt demonstrates ability to complete ADLs using compensatory techniques without assist. dons brace independently     Vision   Vision Assessment?: No apparent visual deficits     Perception     Praxis      Pertinent Vitals/Pain Pain Assessment Pain Assessment: Faces Faces Pain Scale: Hurts little more Pain Location: back- incisional Pain Descriptors / Indicators: Discomfort Pain Intervention(s): Limited activity within patient's tolerance, Monitored during session, Repositioned     Hand Dominance Right   Extremity/Trunk Assessment Upper Extremity Assessment Upper Extremity Assessment: Overall WFL for tasks assessed   Lower Extremity Assessment Lower Extremity Assessment: Defer to PT evaluation   Cervical / Trunk Assessment Cervical / Trunk Assessment: Back Surgery   Communication Communication Communication: No difficulties   Cognition Arousal/Alertness: Awake/alert Behavior During Therapy: WFL for tasks assessed/performed Overall Cognitive Status: Within Functional Limits for tasks assessed  General Comments  pt with good understanding of back precautions, reviewed compensatory techniques, activity progression, IADLs, and DME.    Exercises     Shoulder Instructions      Home Living Family/patient expects to be discharged to:: Private residence Living Arrangements: Spouse/significant other;Children Available Help at Discharge: Family;Available 24 hours/day Type of Home: House Home Access: Stairs to enter [employer] of Steps: 4-5 Entrance Stairs-Rails: Can reach both Home Layout: One level     Bathroom Shower/Tub: Chief Strategy Officer: Handicapped height     Home Equipment: Agricultural consultant (2 wheels);Cane - single  point;BSC/3in1;Adaptive equipment;Scientist, research (medical) (4 wheels) Adaptive Equipment: Long-handled sponge        Prior Functioning/Environment Prior Level of Function : Independent/Modified Independent             Mobility Comments: no AD ADLs Comments: works as a Engineer, 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        OT Problem List: Decreased activity tolerance;Decreased knowledge of use of DME or AE;Decreased knowledge of precautions;Pain      OT Treatment/Interventions:      OT Goals(Current goals can be found in the care plan section) Acute Rehab OT Goals Patient Stated Goal: home OT Goal Formulation: With patient  OT Frequency:      Co-evaluation              AM-PAC OT "6 Clicks" Daily Activity     Outcome Measure Help from another person eating meals?: None Help from another person taking care of personal grooming?: None Help from another person toileting, which includes using toliet, bedpan, or urinal?: None Help from another person bathing (including washing, rinsing, drying)?: None Help from another person to put on and taking off regular upper body clothing?: None Help from another person to put on and taking off regular lower body clothing?: None 6 Click Score: 24   End of Session Equipment Utilized During Treatment: Back brace Nurse Communication: Mobility status  Activity Tolerance: Patient tolerated treatment well Patient left: with call bell/phone within reach  OT Visit Diagnosis: Pain;Other abnormalities of gait and mobility (R26.89) Pain - part of body:  (back)                Time: 4782-9562 OT Time Calculation (min): 22 min Charges:  OT General Charges $OT Visit: 1 Visit OT Evaluation $OT Eval Low Complexity: 1 Low  Barry Brunner, OT Acute Rehabilitation Services Office [phone]   Chancy Milroy 12/11/2022, 8:42 AM

## 2022-12-11 NOTE — Discharge Summary (Signed)
Physician Discharge Summary  Patient ID: Emma Johnson MRN: [id number] DOB/AGE: 48-Nov-1977 48 y.o.  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 The patient tolerated the procedure well and was taken to the recovery room and then to the floor in stable condition. The hospital course was routine. There were no complications. The wound remained clean dry and intact. Pt had appropriate back soreness. No complaints of leg pain or new N/T/W. The patient remained afebrile with stable vital signs, and tolerated a regular diet. The patient continued to increase activities, and pain was well controlled with oral pain medications.   Consults: None  Significant Diagnostic Studies:  Results for orders placed or performed during the hospital encounter of 12/10/22  Pregnancy, urine POC  Result Value Ref Range   Preg Test, Ur NEGATIVE NEGATIVE    DG Lumbar Spine 2-3 Views  Result Date: 12/10/2022 CLINICAL DATA:  L3-L4 posterior fusion. EXAM: LUMBAR SPINE - 2-3 VIEW COMPARISON:  Lumbar spine MRI 11/09/2022. FINDINGS: Two C-arm fluoroscopic images were obtained intraoperatively and submitted for post operative interpretation. Postsurgical changes reflecting posterior instrumented fusion with interbody spacer placement at L3-L4 are noted. Hardware alignment is within expected limits, without evidence of immediate complication. Fluoro time 43 seconds, dose 32.3 mGy. Please see the performing provider's procedural report for further detail. IMPRESSION: Intraoperative images during L3-L4 posterior fusion as above. Electronically Signed   By: Lesia Hausen M.D.   On: 12/10/2022 11:05   DG C-Arm 1-60 Min-No Report  Result Date: 12/10/2022 Fluoroscopy was utilized by  the requesting physician.  No radiographic interpretation.   DG C-Arm 1-60 Min-No Report  Result Date: 12/10/2022 Fluoroscopy was utilized by the requesting physician.  No radiographic interpretation.    Antibiotics:  Anti-infectives (From admission, onward)    Start     Dose/Rate Route Frequency Ordered Stop   12/10/22 1700  ceFAZolin (ANCEF) IVPB 2g/100 mL premix        2 g 200 mL/hr over 30 Minutes Intravenous Every 8 hours 12/10/22 1228 12/10/22 2334   12/10/22 0700  ceFAZolin (ANCEF) IVPB 2g/100 mL premix        2 g 200 mL/hr over 30 Minutes Intravenous On call to O.R. 12/10/22 5621 12/10/22 3086       Discharge Exam: Blood pressure (!) 120/55, pulse 68, temperature 98.4 F (36.9 C), temperature source Oral, resp. rate 18, height 5\' 9"  (1.753 m), weight 93.9 kg, SpO2 96 %. Neurologic: Grossly normal Dressing dry  Discharge Medications:   Allergies as of 12/11/2022       Reactions   Bee Venom Swelling, Rash   Latex Rash   Onion    Blisters in mouth and bloating        Medication List     STOP taking these medications    HYDROcodone-acetaminophen 10-325 MG tablet Commonly known as: NORCO       TAKE these medications    acetaminophen 500 MG tablet Commonly known as: TYLENOL Take 1,000 mg by mouth every 6 (six) hours as needed for moderate pain.   B-12 PO Take 1 tablet by mouth daily.   carboxymethylcellulose 0.5 % Soln Commonly known as: REFRESH PLUS Place 1 drop into both eyes 3 (three) times daily as needed (dry eyes).   HAIR SKIN & NAILS  GUMMIES PO Take 2 capsules by mouth daily.   meloxicam 15 MG tablet Commonly known as: MOBIC Take 15 mg by mouth daily.   Oxycodone HCl 10 MG Tabs Take 1 tablet (10 mg total) by mouth every 4 (four) hours as needed for severe pain ((score 7 to 10)).   pregabalin 150 MG capsule Commonly known as: LYRICA Take 150 mg by mouth 3 (three) times daily.               Durable Medical Equipment  (From  admission, onward)           Start     Ordered   12/10/22 1257  DME Walker rolling  Once       Question:  Patient needs a walker to treat with the following condition  Answer:  S/P lumbar fusion   12/10/22 1256   12/10/22 1257  DME 3 n 1  Once        12/10/22 1256            Disposition: home   Final Dx: PLIF L3-4  Discharge Instructions      Remove dressing in 72 hours   Complete by: As directed    Call MD for:  difficulty breathing, headache or visual disturbances   Complete by: As directed    Call MD for:  persistant nausea and vomiting   Complete by: As directed    Call MD for:  redness, tenderness, or signs of infection (pain, swelling, redness, odor or green/yellow discharge around incision site)   Complete by: As directed    Call MD for:  severe uncontrolled pain   Complete by: As directed    Call MD for:  temperature >100.4   Complete by: As directed    Diet - low sodium heart healthy   Complete by: As directed    Increase activity slowly   Complete by: As directed         Follow-up Information     Tia Alert, MD. Schedule an appointment as soon as possible for a visit in 2 week(s).   Specialty: Neurosurgery Contact information: [address] [place] [address] [phone]                  Signed: Tia Alert 12/11/2022, 7:50 AM

## 2022-12-31 ENCOUNTER — Ambulatory Visit (INDEPENDENT_AMBULATORY_CARE_PROVIDER_SITE_OTHER): Payer: BC Managed Care – PPO | Admitting: Family Medicine

## 2022-12-31 VITALS — BP 101/61 | HR 91 | Temp 97.7°F | Ht 69.0 in | Wt 208.0 lb

## 2022-12-31 DIAGNOSIS — F411 Generalized anxiety disorder: Secondary | ICD-10-CM

## 2022-12-31 DIAGNOSIS — D696 Thrombocytopenia, unspecified: Secondary | ICD-10-CM | POA: Diagnosis not present

## 2022-12-31 DIAGNOSIS — Z13220 Encounter for screening for lipoid disorders: Secondary | ICD-10-CM

## 2022-12-31 MED ORDER — ESCITALOPRAM OXALATE 10 MG PO TABS
10.0000 mg | ORAL_TABLET | Freq: Every day | ORAL | 3 refills | Status: DC
Start: 2022-12-31 — End: 2023-08-26

## 2022-12-31 MED ORDER — CLONAZEPAM 0.5 MG PO TABS: 0.5000 mg | ORAL_TABLET | Freq: Two times a day (BID) | ORAL | 1 refills | Status: DC | PRN

## 2022-12-31 NOTE — Patient Instructions (Signed)
Labs today.   Medications as prescribed.  Follow up in 6 weeks.

## 2022-12-31 NOTE — Progress Notes (Signed)
Subjective:  Patient ID: Emma Johnson, female    DOB: 1976-01-29  Age: 47 y.o. MRN: [id number]  CC: Chief Complaint  Patient presents with   Establish Care    Recent back surgery lumbar infusion , cbc out of range - request recheck    HPI:  47 year old female presents to establish care.  Patient recently had a lumbar fusion on 5/1.  She is recovering well but finds herself quite anxious and overwhelmed particular by the fact that she is so limited in what she can do.  She states that she feels sad and anxious.  She has a history of anxiety.  She states she was previously on treatment with Lexapro and Klonopin as needed.  She would like to revisit this today.  Patient also reports that she had recent labs which revealed thrombocytopenia and mildly elevated hematocrit.  She would like labs to be rechecked again.  Patient is overdue for health maintenance items.  She is overdue for colonoscopy and Pap smear.  Patient Active Problem List   Diagnosis Date Noted   S/P lumbar fusion 12/10/2022   Obsessive compulsive disorder 01/03/2013   Generalized anxiety disorder 11/12/2012    Social Hx   Social History   Socioeconomic History   Marital status: Single    Spouse name: Not on file   Number of children: Not on file   Years of education: Not on file   Highest education level: Associate degree: academic program  Occupational History   Not on file  Tobacco Use   Smoking status: Every Day    Packs/day: 0.50    Years: 20.00    Additional pack years: 0.00    Total pack years: 10.00    Types: Cigarettes   Smokeless tobacco: Never   Tobacco comments:    trying to quit  Vaping Use   Vaping Use: Never used  Substance and Sexual Activity   Alcohol use: No   Drug use: No    Comment: No Hx IV Abuse   Sexual activity: Yes    Birth control/protection: None, Other-see comments  Other Topics Concern   Not on file  Social History Narrative   Not on file   Social  Determinants of Health   Financial Resource Strain: Low Risk  (12/31/2022)   Overall Financial Resource Strain (CARDIA)    Difficulty of Paying Living Expenses: Not hard at all  Food Insecurity: No Food Insecurity (12/31/2022)   Hunger Vital Sign    Worried About Running Out of Food in the Last Year: Never true    Ran Out of Food in the Last Year: Never true  Transportation Needs: No Transportation Needs (12/31/2022)   PRAPARE - Administrator, Civil Service (Medical): No    Lack of Transportation (Non-Medical): No  Physical Activity: Sufficiently Active (12/31/2022)   Exercise Vital Sign    Days of Exercise per Week: 7 days    Minutes of Exercise per Session: 40 min  Stress: Stress Concern Present (12/31/2022)   [employer] of Occupational Health - Occupational Stress Questionnaire    Feeling of Stress : To some extent  Social Connections: Unknown (12/31/2022)   Social Connection and Isolation Panel [NHANES]    Frequency of Communication with Friends and Family: More than three times a week    Frequency of Social Gatherings with Friends and Family: More than three times a week    Attends Religious Services: Patient declined    Active Member of Clubs or  Organizations: No    Attends Engineer, structural: Not on file    Marital Status: Not on file    Review of Systems Per HPI  Objective:  BP 101/61   Pulse 91   Temp 97.7 F (36.5 C)   Ht 5\' 9"  (1.753 m)   Wt 208 lb (94.3 kg)   SpO2 98%   BMI 30.72 kg/m      12/31/2022    8:24 AM 12/11/2022    8:15 AM 12/11/2022    4:15 AM  BP/Weight  Systolic BP 101 115 120  Diastolic BP 61 57 55  Wt. (Lbs) 208    BMI 30.72 kg/m2      Physical Exam Vitals and nursing note reviewed.  Constitutional:      General: She is not in acute distress.    Appearance: Normal appearance.  HENT:     Head: Normocephalic and atraumatic.  Eyes:     General:        Right eye: No discharge.        Left eye: No discharge.      Conjunctiva/sclera: Conjunctivae normal.  Cardiovascular:     Rate and Rhythm: Normal rate and regular rhythm.  Pulmonary:     Effort: Pulmonary effort is normal.     Breath sounds: Normal breath sounds. No wheezing, rhonchi or rales.  Abdominal:     General: There is no distension.     Palpations: Abdomen is soft.     Tenderness: There is no abdominal tenderness.  Neurological:     Mental Status: She is alert.  Psychiatric:     Comments: Anxious.     Lab Results  Component Value Date   WBC 7.0 12/04/2022   HGB 14.9 12/04/2022   HCT 46.7 (H) 12/04/2022   PLT 132 (L) 12/04/2022   GLUCOSE 89 12/04/2022   ALT 10 05/11/2013   AST 16 05/11/2013   NA 138 12/04/2022   K 4.1 12/04/2022   CL 109 12/04/2022   CREATININE 0.73 12/04/2022   BUN 11 12/04/2022   CO2 23 12/04/2022   INR 1.1 12/04/2022     Assessment & Plan:   Problem List Items Addressed This Visit       Other   Generalized anxiety disorder - Primary    Uncontrolled. Worsening. Starting Lexapro.  Klonopin as needed.      Relevant Medications   escitalopram (LEXAPRO) 10 MG tablet   Other Visit Diagnoses     Thrombocytopenia (HCC)       Relevant Orders   CBC   Hypocalcemia       Relevant Orders   CMP14+EGFR   Screening, lipid       Relevant Orders   Lipid panel       Meds ordered this encounter  Medications   escitalopram (LEXAPRO) 10 MG tablet    Sig: Take 1 tablet (10 mg total) by mouth daily.    Dispense:  90 tablet    Refill:  3   clonazePAM (KLONOPIN) 0.5 MG tablet    Sig: Take 1 tablet (0.5 mg total) by mouth 2 (two) times daily as needed for anxiety.    Dispense:  60 tablet    Refill:  1    Follow-up:  Return in about 6 weeks (around 02/11/2023).  Everlene Other DO [facility]

## 2022-12-31 NOTE — Assessment & Plan Note (Addendum)
Uncontrolled. Worsening. Starting Lexapro.  Klonopin as needed.

## 2023-01-01 LAB — CMP14+EGFR
ALT: 14 IU/L (ref 0–32)
AST: 15 IU/L (ref 0–40)
Albumin/Globulin Ratio: 1.6 (ref 1.2–2.2)
Albumin: 4 g/dL (ref 3.9–4.9)
Alkaline Phosphatase: 112 IU/L (ref 44–121)
BUN/Creatinine Ratio: 16 (ref 9–23)
BUN: 12 mg/dL (ref 6–24)
Bilirubin Total: 0.4 mg/dL (ref 0.0–1.2)
CO2: 22 mmol/L (ref 20–29)
Calcium: 8.8 mg/dL (ref 8.7–10.2)
Chloride: 106 mmol/L (ref 96–106)
Creatinine, Ser: 0.73 mg/dL (ref 0.57–1.00)
Globulin, Total: 2.5 g/dL (ref 1.5–4.5)
Glucose: 90 mg/dL (ref 70–99)
Potassium: 4.8 mmol/L (ref 3.5–5.2)
Sodium: 141 mmol/L (ref 134–144)
Total Protein: 6.5 g/dL (ref 6.0–8.5)
eGFR: 101 mL/min/{1.73_m2} (ref >59)

## 2023-01-01 LAB — LIPID PANEL
Chol/HDL Ratio: 4 ratio (ref 0.0–4.4)
Cholesterol, Total: 158 mg/dL (ref 100–199)
HDL: 40 mg/dL (ref >39)
LDL Chol Calc (NIH): 99 mg/dL (ref 0–99)
Triglycerides: 104 mg/dL (ref 0–149)
VLDL Cholesterol Cal: 19 mg/dL (ref 5–40)

## 2023-01-01 LAB — CBC
Hematocrit: 45.1 % (ref 34.0–46.6)
Hemoglobin: 14.5 g/dL (ref 11.1–15.9)
MCH: 29.6 pg (ref 26.6–33.0)
MCHC: 32.2 g/dL (ref 31.5–35.7)
MCV: 92 fL (ref 79–97)
Platelets: 178 10*3/uL (ref 150–450)
RBC: 4.9 x10E6/uL (ref 3.77–5.28)
RDW: 13.1 % (ref 11.7–15.4)
WBC: 5.9 10*3/uL (ref 3.4–10.8)

## 2023-01-22 DIAGNOSIS — M5416 Radiculopathy, lumbar region: Secondary | ICD-10-CM | POA: Diagnosis not present

## 2023-01-30 DIAGNOSIS — G90521 Complex regional pain syndrome I of right lower limb: Secondary | ICD-10-CM | POA: Diagnosis not present

## 2023-01-30 DIAGNOSIS — F1190 Opioid use, unspecified, uncomplicated: Secondary | ICD-10-CM | POA: Diagnosis not present

## 2023-02-23 ENCOUNTER — Ambulatory Visit (INDEPENDENT_AMBULATORY_CARE_PROVIDER_SITE_OTHER): Payer: BC Managed Care – PPO | Admitting: Family Medicine

## 2023-02-23 VITALS — BP 131/74 | HR 75 | Temp 97.5°F | Ht 69.0 in | Wt 213.6 lb

## 2023-02-23 DIAGNOSIS — F411 Generalized anxiety disorder: Secondary | ICD-10-CM | POA: Diagnosis not present

## 2023-02-23 DIAGNOSIS — Z23 Encounter for immunization: Secondary | ICD-10-CM

## 2023-02-23 NOTE — Patient Instructions (Signed)
 Continue your medications.  Follow up in 6 months.  Call or message with concerns.  Take care  Dr. Lacinda Axon

## 2023-02-23 NOTE — Assessment & Plan Note (Signed)
Improved and doing well.  Offered increase in Lexapro.  Patient states that she wants to keep it as it is.  Continue Lexapro and as needed Klonopin.

## 2023-02-23 NOTE — Progress Notes (Signed)
Subjective:  Patient ID: Emma Johnson, female    DOB: February 18, 1976  Age: 47 y.o. MRN: [id number]  CC: Chief Complaint  Patient presents with   Anxiety    HPI:  47 year old female presents for follow-up.  Patient reports that she is doing well.  Her anxiety has improved significantly on Lexapro.  She is very happy with how she is doing.  PHQ-9 score of 8.  GAD-7 score of 10.  Additionally, her pain has improved.  She continues following with neurosurgery.  She is off oxycodone.  Patient Active Problem List   Diagnosis Date Noted   S/P lumbar fusion 12/10/2022   Obsessive compulsive disorder 01/03/2013   Generalized anxiety disorder 11/12/2012    Social Hx   Social History   Socioeconomic History   Marital status: Single    Spouse name: Not on file   Number of children: Not on file   Years of education: Not on file   Highest education level: Associate degree: academic program  Occupational History   Not on file  Tobacco Use   Smoking status: Every Day    Current packs/day: 0.50    Average packs/day: 0.5 packs/day for 20.0 years (10.0 ttl pk-yrs)    Types: Cigarettes   Smokeless tobacco: Never   Tobacco comments:    trying to quit  Vaping Use   Vaping status: Never Used  Substance and Sexual Activity   Alcohol use: No   Drug use: No    Comment: No Hx IV Abuse   Sexual activity: Yes    Birth control/protection: None, Other-see comments  Other Topics Concern   Not on file  Social History Narrative   Not on file   Social Determinants of Health   Financial Resource Strain: Low Risk  (12/31/2022)   Overall Financial Resource Strain (CARDIA)    Difficulty of Paying Living Expenses: Not hard at all  Food Insecurity: No Food Insecurity (12/31/2022)   Hunger Vital Sign    Worried About Running Out of Food in the Last Year: Never true    Ran Out of Food in the Last Year: Never true  Transportation Needs: No Transportation Needs (12/31/2022)   PRAPARE -  Administrator, Civil Service (Medical): No    Lack of Transportation (Non-Medical): No  Physical Activity: Sufficiently Active (12/31/2022)   Exercise Vital Sign    Days of Exercise per Week: 7 days    Minutes of Exercise per Session: 40 min  Stress: Stress Concern Present (12/31/2022)   [employer] of Occupational Health - Occupational Stress Questionnaire    Feeling of Stress : To some extent  Social Connections: Unknown (12/31/2022)   Social Connection and Isolation Panel [NHANES]    Frequency of Communication with Friends and Family: More than three times a week    Frequency of Social Gatherings with Friends and Family: More than three times a week    Attends Religious Services: Patient declined    Database administrator or Organizations: No    Attends Engineer, structural: Not on file    Marital Status: Not on file    Review of Systems Per HPI  Objective:  BP 131/74   Pulse 75   Temp (!) 97.5 F (36.4 C)   Ht 5\' 9"  (1.753 m)   Wt 213 lb 9.6 oz (96.9 kg)   SpO2 99%   BMI 31.54 kg/m      02/23/2023    9:49 AM 12/31/2022  8:24 AM 12/11/2022    8:15 AM  BP/Weight  Systolic BP 131 101 115  Diastolic BP 74 61 57  Wt. (Lbs) 213.6 208   BMI 31.54 kg/m2 30.72 kg/m2     Physical Exam Vitals and nursing note reviewed.  Constitutional:      Appearance: Normal appearance. She is obese.  Eyes:     General:        Right eye: No discharge.        Left eye: No discharge.     Conjunctiva/sclera: Conjunctivae normal.  Cardiovascular:     Rate and Rhythm: Normal rate and regular rhythm.  Pulmonary:     Effort: Pulmonary effort is normal.     Breath sounds: Normal breath sounds. No wheezing or rales.  Neurological:     Mental Status: She is alert.  Psychiatric:        Mood and Affect: Mood normal.        Behavior: Behavior normal.     Lab Results  Component Value Date   WBC 5.9 12/31/2022   HGB 14.5 12/31/2022   HCT 45.1 12/31/2022   PLT  178 12/31/2022   GLUCOSE 90 12/31/2022   CHOL 158 12/31/2022   TRIG 104 12/31/2022   HDL 40 12/31/2022   LDLCALC 99 12/31/2022   ALT 14 12/31/2022   AST 15 12/31/2022   NA 141 12/31/2022   K 4.8 12/31/2022   CL 106 12/31/2022   CREATININE 0.73 12/31/2022   BUN 12 12/31/2022   CO2 22 12/31/2022   INR 1.1 12/04/2022     Assessment & Plan:   Problem List Items Addressed This Visit       Other   Generalized anxiety disorder - Primary    Improved and doing well.  Offered increase in Lexapro.  Patient states that she wants to keep it as it is.  Continue Lexapro and as needed Klonopin.      Other Visit Diagnoses     Immunization due       Relevant Orders   Tdap vaccine greater than or equal to 7yo IM (Completed)      Follow-up:  Return in about 6 months (around 08/26/2023).  Everlene Other DO [facility]

## 2023-05-14 DIAGNOSIS — M5416 Radiculopathy, lumbar region: Secondary | ICD-10-CM | POA: Diagnosis not present

## 2023-05-15 DIAGNOSIS — M5416 Radiculopathy, lumbar region: Secondary | ICD-10-CM | POA: Diagnosis not present

## 2023-05-15 DIAGNOSIS — Z6830 Body mass index (BMI) 30.0-30.9, adult: Secondary | ICD-10-CM | POA: Diagnosis not present

## 2023-05-15 DIAGNOSIS — G90521 Complex regional pain syndrome I of right lower limb: Secondary | ICD-10-CM | POA: Diagnosis not present

## 2023-07-01 ENCOUNTER — Encounter: Payer: Self-pay | Admitting: Adult Health

## 2023-07-01 ENCOUNTER — Ambulatory Visit: Payer: Medicaid Other | Admitting: Adult Health

## 2023-07-01 VITALS — BP 111/60 | HR 75 | Ht 69.0 in | Wt 221.0 lb

## 2023-07-01 DIAGNOSIS — N6321 Unspecified lump in the left breast, upper outer quadrant: Secondary | ICD-10-CM | POA: Insufficient documentation

## 2023-07-01 NOTE — Progress Notes (Signed)
Subjective:     Patient ID: Emma Johnson, female   DOB: 09/22/1975, 47 y.o.   MRN: [id number]  HPI Ciandra is a 47 year old white female, single, [id number] in complaining of left breast mass for about a week, has had some stinging. She has never had mammogram. She needs pap but declines today, will get back in for pap and physical. She has had 3 back surgeries in last 2 21/2 years.  PCP is Dr Adriana Simas  Review of Systems + left breast mass noticed about a week, has had some stinging No known injury Reviewed past medical,surgical, social and family history. Reviewed medications and allergies.     Objective:   Physical Exam BP 111/60 (BP Location: Left Arm, Patient Position: Sitting, Cuff Size: Normal)   Pulse 75   Ht 5\' 9"  (1.753 m)   Wt 221 lb (100.2 kg)   BMI 32.64 kg/m      Skin warm and dry,  Breasts:no dominate palpable mass, retraction or nipple discharge on right, on the left, no retraction or nipple discharge, has cluster of BBs at 1 0'clock at edge of areola, tender and non mobile.  AA is 0    07/01/2023    9:38 AM 02/23/2023   10:04 AM 12/31/2022    8:33 AM  Depression screen PHQ 2/9  Decreased Interest 0 0 2  Down, Depressed, Hopeless 0 0 0  PHQ - 2 Score 0 0 2  Altered sleeping 1 2 3   Tired, decreased energy 2 2 3   Change in appetite 0 2 0  Feeling bad or failure about yourself  0 0 0  Trouble concentrating 3 2 3   Moving slowly or fidgety/restless 0 0 0  Suicidal thoughts 0 0 0  PHQ-9 Score 6 8 11   Difficult doing work/chores  Somewhat difficult Very difficult   On meds    07/01/2023    9:38 AM 02/23/2023   10:04 AM 12/31/2022    8:33 AM 09/07/2018    8:35 AM  GAD 7 : Generalized Anxiety Score  Nervous, Anxious, on Edge 0 2 1 0  Control/stop worrying 1 2 3 2   Worry too much - different things 0 2 2 2   Trouble relaxing 1 2 2 3   Restless 0 2 3 2   Easily annoyed or irritable 0 0 3 1  Afraid - awful might happen 0 0 3 1  Total GAD 7 Score 2 10 17 11    Anxiety Difficulty  Somewhat difficult Very difficult Somewhat difficult    Upstream - 07/01/23 4742       Pregnancy Intention Screening   Does the patient want to become pregnant in the next year? No    Does the patient's partner want to become pregnant in the next year? No    Would the patient like to discuss contraceptive options today? No      Contraception Wrap Up   Current Method No Method - Other Reason    End Method No Method - Other Reason   ablation   Contraception Counseling Provided No               Assessment:     1. Mass of upper outer quadrant of left breast +left breast cluster of BBs at 1 0'clock at edge of areola,tender and non mobile Diagnostic mammogram scheduled for 08/11/23 at 1:40 pm at [facility]  - [place] LIMITED ULTRASOUND INCLUDING AXILLA RIGHT BREAST; Future - MM 3D DIAGNOSTIC MAMMOGRAM BILATERAL BREAST; Future - [place]  LIMITED ULTRASOUND INCLUDING AXILLA LEFT BREAST ; Future     Plan:     Return 07/28/23 for pap and physical

## 2023-07-28 ENCOUNTER — Ambulatory Visit: Payer: Medicaid Other | Admitting: Adult Health

## 2023-08-11 ENCOUNTER — Ambulatory Visit (HOSPITAL_COMMUNITY)
Admission: RE | Admit: 2023-08-11 | Discharge: 2023-08-11 | Disposition: A | Discharge status: Home / Self Care | Payer: Medicaid Other | Source: Ambulatory Visit | Attending: Adult Health | Admitting: Adult Health

## 2023-08-11 ENCOUNTER — Encounter (HOSPITAL_COMMUNITY): Payer: Self-pay

## 2023-08-11 DIAGNOSIS — R928 Other abnormal and inconclusive findings on diagnostic imaging of breast: Secondary | ICD-10-CM | POA: Diagnosis not present

## 2023-08-11 DIAGNOSIS — N6321 Unspecified lump in the left breast, upper outer quadrant: Secondary | ICD-10-CM | POA: Diagnosis not present

## 2023-08-11 DIAGNOSIS — N6314 Unspecified lump in the right breast, lower inner quadrant: Secondary | ICD-10-CM | POA: Diagnosis not present

## 2023-08-11 DIAGNOSIS — R92323 Mammographic fibroglandular density, bilateral breasts: Secondary | ICD-10-CM | POA: Diagnosis not present

## 2023-08-11 DIAGNOSIS — N6011 Diffuse cystic mastopathy of right breast: Secondary | ICD-10-CM | POA: Diagnosis not present

## 2023-08-18 DIAGNOSIS — M5416 Radiculopathy, lumbar region: Secondary | ICD-10-CM | POA: Diagnosis not present

## 2023-08-26 ENCOUNTER — Ambulatory Visit: Payer: Medicaid Other | Admitting: Family Medicine

## 2023-08-26 ENCOUNTER — Encounter: Payer: Self-pay | Admitting: Family Medicine

## 2023-08-26 VITALS — BP 119/75 | HR 72 | Temp 97.2°F | Ht 69.0 in | Wt 221.0 lb

## 2023-08-26 DIAGNOSIS — F411 Generalized anxiety disorder: Secondary | ICD-10-CM

## 2023-08-26 MED ORDER — ESCITALOPRAM OXALATE 20 MG PO TABS: 20.0000 mg | ORAL_TABLET | Freq: Every day | ORAL | 3 refills | Status: DC

## 2023-08-26 NOTE — Progress Notes (Signed)
 Subjective:  Patient ID: Emma Johnson, female    DOB: [birth year]  Age: 48 y.o. MRN: [id number]  CC:   Chief Complaint  Patient presents with   Follow-up    Follow up for generalized anxiety disorder. Some good days and some bad days. Feels like anxiety increases and decreases randomly. Having trouble focusing.     HPI:  48 year old female presents for follow-up.  Having significant difficulty with anxiety.  Reports a great deal of anxiety.  Has difficulty resting and relaxing.  Has to always be doing something.  Has difficulty completing tasks due to this.  She is currently on Lexapro  10 mg daily.  Will discuss dose increase today.  Patient overdue for health maintenance items.  Advised that she needs to get a Pap smear.  Also needs colonoscopy.  Patient Active Problem List   Diagnosis Date Noted   S/P lumbar fusion 12/10/2022   Generalized anxiety disorder 11/12/2012    Social Hx   Social History   Socioeconomic History   Marital status: Single    Spouse name: Not on file   Number of children: Not on file   Years of education: Not on file   Highest education level: Associate degree: academic program  Occupational History   Not on file  Tobacco Use   Smoking status: Every Day    Current packs/day: 0.50    Average packs/day: 0.5 packs/day for 20.0 years (10.0 ttl pk-yrs)    Types: Cigarettes   Smokeless tobacco: Never   Tobacco comments:    trying to quit  Vaping Use   Vaping status: Never Used  Substance and Sexual Activity   Alcohol use: No   Drug use: No    Comment: No Hx IV Abuse   Sexual activity: Yes    Birth control/protection: None, Other-see comments  Other Topics Concern   Not on file  Social History Narrative   Not on file   Social Drivers of Health   Financial Resource Strain: Low Risk  (07/01/2023)   Overall Financial Resource Strain (CARDIA)    Difficulty of Paying Living Expenses: Not hard at all  Food Insecurity: No Food Insecurity  (07/01/2023)   Hunger Vital Sign    Worried About Running Out of Food in the Last Year: Never true    Ran Out of Food in the Last Year: Never true  Transportation Needs: No Transportation Needs (07/01/2023)   PRAPARE - Administrator, Civil Service (Medical): No    Lack of Transportation (Non-Medical): No  Physical Activity: Insufficiently Active (07/01/2023)   Exercise Vital Sign    Days of Exercise per Week: 2 days    Minutes of Exercise per Session: 30 min  Stress: No Stress Concern Present (07/01/2023)   [employer] of Occupational Health - Occupational Stress Questionnaire    Feeling of Stress : Only a little  Social Connections: Moderately Integrated (07/01/2023)   Social Connection and Isolation Panel [NHANES]    Frequency of Communication with Friends and Family: More than three times a week    Frequency of Social Gatherings with Friends and Family: More than three times a week    Attends Religious Services: 1 to 4 times per year    Active Member of [employer] or Organizations: No    Attends Banker Meetings: Never    Marital Status: Living with partner    Review of Systems Per HPI  Objective:  BP 119/75   Pulse 72   Temp (!)  97.2 F (36.2 C)   Ht 5\' 9"  (1.753 m)   Wt 221 lb (100.2 kg)   SpO2 98%   BMI 32.64 kg/m      08/26/2023    9:51 AM 07/01/2023    9:35 AM 02/23/2023    9:49 AM  BP/Weight  Systolic BP 119 111 131  Diastolic BP 75 60 74  Wt. (Lbs) 221 221 213.6  BMI 32.64 kg/m2 32.64 kg/m2 31.54 kg/m2    Physical Exam Vitals and nursing note reviewed.  Constitutional:      General: She is not in acute distress.    Appearance: Normal appearance.  HENT:     Head: Normocephalic and atraumatic.  Eyes:     General:        Right eye: No discharge.        Left eye: No discharge.     Conjunctiva/sclera: Conjunctivae normal.  Cardiovascular:     Rate and Rhythm: Normal rate and regular rhythm.  Pulmonary:     Effort:  Pulmonary effort is normal.     Breath sounds: Normal breath sounds. No wheezing, rhonchi or rales.  Neurological:     Mental Status: She is alert.  Psychiatric:     Comments: Anxious.     Lab Results  Component Value Date   WBC 5.9 12/31/2022   HGB 14.5 12/31/2022   HCT 45.1 12/31/2022   PLT 178 12/31/2022   GLUCOSE 90 12/31/2022   CHOL 158 12/31/2022   TRIG 104 12/31/2022   HDL 40 12/31/2022   LDLCALC 99 12/31/2022   ALT 14 12/31/2022   AST 15 12/31/2022   NA 141 12/31/2022   K 4.8 12/31/2022   CL 106 12/31/2022   CREATININE 0.73 12/31/2022   BUN 12 12/31/2022   CO2 22 12/31/2022   INR 1.1 12/04/2022     Assessment & Plan:   Problem List Items Addressed This Visit       Other   Generalized anxiety disorder - Primary   Uncontrolled.  Having significant difficulty.  Increasing Lexapro  to 20 mg daily.      Relevant Medications   escitalopram  (LEXAPRO ) 20 MG tablet    Meds ordered this encounter  Medications   escitalopram  (LEXAPRO ) 20 MG tablet    Sig: Take 1 tablet (20 mg total) by mouth daily.    Dispense:  90 tablet    Refill:  3    Follow-up:  6 months  Thora Scherman Emma Fan DO [facility]

## 2023-08-26 NOTE — Patient Instructions (Addendum)
 Lexapro  increased.  Please get your pap smear (see Jearlean Mince or Orelia Binet).  Consider colonoscopy (screening starts at 45).  Follow up in 6 months.

## 2023-08-26 NOTE — Assessment & Plan Note (Signed)
 Uncontrolled.  Having significant difficulty.  Increasing Lexapro  to 20 mg daily.

## 2023-09-07 ENCOUNTER — Ambulatory Visit: Payer: Medicaid Other | Admitting: Adult Health

## 2023-09-09 ENCOUNTER — Encounter: Payer: Self-pay | Admitting: Emergency Medicine

## 2023-09-09 ENCOUNTER — Ambulatory Visit
Admission: EM | Admit: 2023-09-09 | Discharge: 2023-09-09 | Disposition: A | Discharge status: Home / Self Care | Payer: Medicaid Other | Attending: Family Medicine | Admitting: Family Medicine

## 2023-09-09 DIAGNOSIS — J208 Acute bronchitis due to other specified organisms: Secondary | ICD-10-CM | POA: Diagnosis not present

## 2023-09-09 DIAGNOSIS — R0781 Pleurodynia: Secondary | ICD-10-CM

## 2023-09-09 MED ORDER — TIZANIDINE HCL 4 MG PO CAPS: 4.0000 mg | ORAL_CAPSULE | Freq: Three times a day (TID) | ORAL | 0 refills | Status: DC | PRN

## 2023-09-09 MED ORDER — ALBUTEROL SULFATE HFA 108 (90 BASE) MCG/ACT IN AERS: 2.0000 | INHALATION_SPRAY | RESPIRATORY_TRACT | 0 refills | Status: DC | PRN

## 2023-09-09 MED ORDER — PROMETHAZINE-DM 6.25-15 MG/5ML PO SYRP: 5.0000 mL | ORAL_SOLUTION | Freq: Four times a day (QID) | ORAL | 0 refills | Status: DC | PRN

## 2023-09-09 MED ORDER — DEXAMETHASONE SODIUM PHOSPHATE 10 MG/ML IJ SOLN
10.0000 mg | Freq: Once | INTRAMUSCULAR | Status: AC
  Administered 2023-09-09: 10 mg via INTRAMUSCULAR

## 2023-09-09 NOTE — ED Provider Notes (Signed)
[facility]    CSN: [id number] Arrival date & time: 09/09/23  1014      History   Chief Complaint No chief complaint on file.   HPI Emma Johnson is a 48 y.o. female.   Patient presenting today with cough, facial pressure, congestion, left side/rib pain with coughing, wheezing, chest tightness.  Denies fever, chest pain, abdominal pain, nausea vomiting or diarrhea.  So far trying numerous over-the-counter cold and congestion medications with mild relief.  No known sick contacts recently.    Past Medical History:  Diagnosis Date   AMA (advanced maternal age) multigravida 35+    Anxiety    Complex regional pain syndrome i of right lower limb    Depression    Fibroids    No pertinent past medical history    PONV (postoperative nausea and vomiting)     Patient Active Problem List   Diagnosis Date Noted   S/P lumbar fusion 12/10/2022   Generalized anxiety disorder 11/12/2012    Past Surgical History:  Procedure Laterality Date   DILATION AND CURETTAGE OF UTERUS     DILITATION & CURRETTAGE/HYSTROSCOPY WITH THERMACHOICE ABLATION N/A 05/18/2013   Procedure: DILATATION & CURETTAGE/HYSTEROSCOPY WITH THERMACHOICE ABLATION (Total Therapy Time=31minutes);  Surgeon: Lazaro Arms, MD;  Location: [facility];  Service: Gynecology;  Laterality: N/A;   LAMINECTOMY AND MICRODISCECTOMY LUMBAR SPINE Right 10/30/2020   LUMBAR LAMINECTOMY/ DECOMPRESSION WITH MET-RX N/A 03/19/2022   Procedure: RIGHT MINIMALLY INVASIVE LUMBAR THREE-FOUR DISCECTOMY;  Surgeon: Jadene Pierini, MD;  Location: [facility];  Service: Neurosurgery;  Laterality: N/A;   POLYPECTOMY N/A 05/18/2013   Procedure: ENDOMETRIAL POLYPECTOMY;  Surgeon: Lazaro Arms, MD;  Location: [facility];  Service: Gynecology;  Laterality: N/A;    OB History     Gravida  5   Para  2   Term  2   Preterm      AB  2   Living  2      SAB      IAB  2   Ectopic      Multiple      Live Births  2             Home Medications    Prior to Admission medications   Medication Sig Start Date End Date Taking? Authorizing Provider  albuterol (VENTOLIN HFA) 108 (90 Base) MCG/ACT inhaler Inhale 2 puffs into the lungs every 4 (four) hours as needed. 09/09/23  Yes Particia Nearing, PA-C  promethazine-dextromethorphan (PROMETHAZINE-DM) 6.25-15 MG/5ML syrup Take 5 mLs by mouth 4 (four) times daily as needed. 09/09/23  Yes Particia Nearing, PA-C  tiZANidine (ZANAFLEX) 4 MG capsule Take 1 capsule (4 mg total) by mouth 3 (three) times daily as needed for muscle spasms. Do not drink alcohol or drive while taking this medication.  May cause drowsiness. 09/09/23  Yes Particia Nearing, PA-C  Biotin w/ Vitamins C & E (HAIR SKIN & NAILS GUMMIES PO) Take 2 capsules by mouth daily.    [provider]  carboxymethylcellulose (REFRESH PLUS) 0.5 % SOLN Place 1 drop into both eyes 3 (three) times daily as needed (dry eyes).    [provider]  Cyanocobalamin (B-12 PO) Take 1 tablet by mouth daily.    [provider]  escitalopram (LEXAPRO) 20 MG tablet Take 1 tablet (20 mg total) by mouth daily. 08/26/23   Tommie Sams, DO  HYDROcodone-acetaminophen (NORCO) 10-325 MG tablet Take 1 tablet by mouth every 6 (six) hours  as needed.    [provider]  LINZESS 290 MCG CAPS capsule Take 290 mcg by mouth daily before breakfast. 08/18/23   [provider]  meloxicam (MOBIC) 15 MG tablet Take 15 mg by mouth daily. 02/25/22   [provider]  pregabalin (LYRICA) 150 MG capsule Take 150 mg by mouth 3 (three) times daily. 02/03/22   [provider]    Family History Family History  Problem Relation Age of Onset   Multiple sclerosis Mother    Hypertension Father    Cancer Maternal Aunt        uterine and lung   Cancer Paternal Uncle        leukemia   Breast cancer Maternal Grandmother    Cancer Maternal Grandmother        breast   Heart disease Paternal  Grandmother    Heart attack Paternal Grandmother    Heart failure Paternal Grandfather     Social History Social History   Tobacco Use   Smoking status: Every Day    Current packs/day: 0.50    Average packs/day: 0.5 packs/day for 20.0 years (10.0 ttl pk-yrs)    Types: Cigarettes   Smokeless tobacco: Never   Tobacco comments:    trying to quit  Vaping Use   Vaping status: Never Used  Substance Use Topics   Alcohol use: No   Drug use: No    Comment: No Hx IV Abuse     Allergies   Bee venom, Latex, and Onion   Review of Systems Review of Systems Per HPI  Physical Exam Triage Vital Signs ED Triage Vitals  Encounter Vitals Group     BP 09/09/23 1024 106/70     Systolic BP Percentile --      Diastolic BP Percentile --      Pulse Rate 09/09/23 1024 88     Resp 09/09/23 1024 18     Temp 09/09/23 1024 98.1 F (36.7 C)     Temp Source 09/09/23 1024 Oral     SpO2 09/09/23 1024 94 %     Weight --      Height --      Head Circumference --      Peak Flow --      Pain Score 09/09/23 1025 0     Pain Loc --      Pain Education --      Exclude from Growth Chart --    No data found.  Updated Vital Signs BP 106/70 (BP Location: Right Arm)   Pulse 88   Temp 98.1 F (36.7 C) (Oral)   Resp 18   SpO2 94%   Visual Acuity Right Eye Distance:   Left Eye Distance:   Bilateral Distance:    Right Eye Near:   Left Eye Near:    Bilateral Near:     Physical Exam Vitals and nursing note reviewed.  Constitutional:      Appearance: Normal appearance.  HENT:     Head: Atraumatic.     Right Ear: Tympanic membrane and external ear normal.     Left Ear: Tympanic membrane and external ear normal.     Nose: Congestion present.     Mouth/Throat:     Mouth: Mucous membranes are moist.     Pharynx: Posterior oropharyngeal erythema present.  Eyes:     Extraocular Movements: Extraocular movements intact.     Conjunctiva/sclera: Conjunctivae normal.  Cardiovascular:     Rate  and Rhythm: Normal rate  and regular rhythm.     Heart sounds: Normal heart sounds.  Pulmonary:     Effort: Pulmonary effort is normal.     Breath sounds: Wheezing present.  Musculoskeletal:        General: Normal range of motion.     Cervical back: Normal range of motion and neck supple.  Skin:    General: Skin is warm and dry.  Neurological:     Mental Status: She is alert and oriented to person, place, and time.  Psychiatric:        Mood and Affect: Mood normal.        Thought Content: Thought content normal.      UC Treatments / Results  Labs (all labs ordered are listed, but only abnormal results are displayed) Labs Reviewed - No data to display  EKG   Radiology No results found.  Procedures Procedures (including critical care time)  Medications Ordered in UC Medications  dexamethasone (DECADRON) injection 10 mg (10 mg Intramuscular Given 09/09/23 1232)    Initial Impression / Assessment and Plan / UC Course  I have reviewed the triage vital signs and the nursing notes.  Pertinent labs & imaging results that were available during my care of the patient were reviewed by me and considered in my medical decision making (see chart for details).     Suspect viral bronchitis, treat with IM Decadron, Zanaflex, albuterol, Phenergan DM, supportive over-the-counter medications and home care.  Return for worsening symptoms.  Final Clinical Impressions(s) / UC Diagnoses   Final diagnoses:  Viral bronchitis  Rib pain on left side   Discharge Instructions   None    ED Prescriptions     Medication Sig Dispense Auth. Provider   tiZANidine (ZANAFLEX) 4 MG capsule Take 1 capsule (4 mg total) by mouth 3 (three) times daily as needed for muscle spasms. Do not drink alcohol or drive while taking this medication.  May cause drowsiness. 15 capsule Particia Nearing, [place]   albuterol (VENTOLIN HFA) 108 (90 Base) MCG/ACT inhaler Inhale 2 puffs into the lungs every 4 (four)  hours as needed. 18 g Roosvelt Maser [place], [place]   promethazine-dextromethorphan (PROMETHAZINE-DM) 6.25-15 MG/5ML syrup Take 5 mLs by mouth 4 (four) times daily as needed. 100 mL Particia Nearing, [place]      PDMP not reviewed this encounter.   Particia Nearing, [place] 09/09/23 1947

## 2023-09-09 NOTE — ED Triage Notes (Signed)
Cough, facial pressure, nasal congestion since Saturday.  States left side hurts when coughing.

## 2023-11-12 DIAGNOSIS — G90521 Complex regional pain syndrome I of right lower limb: Secondary | ICD-10-CM | POA: Diagnosis not present

## 2024-01-07 DIAGNOSIS — G90521 Complex regional pain syndrome I of right lower limb: Secondary | ICD-10-CM | POA: Diagnosis not present

## 2024-02-22 ENCOUNTER — Encounter: Payer: Self-pay | Admitting: Family Medicine

## 2024-02-22 ENCOUNTER — Ambulatory Visit: Payer: Medicaid Other | Admitting: Family Medicine

## 2024-02-22 VITALS — BP 116/76 | HR 82 | Temp 98.1°F | Ht 69.0 in | Wt 228.0 lb

## 2024-02-22 DIAGNOSIS — F411 Generalized anxiety disorder: Secondary | ICD-10-CM | POA: Diagnosis not present

## 2024-02-22 DIAGNOSIS — G90521 Complex regional pain syndrome I of right lower limb: Secondary | ICD-10-CM | POA: Insufficient documentation

## 2024-02-22 DIAGNOSIS — F1120 Opioid dependence, uncomplicated: Secondary | ICD-10-CM | POA: Insufficient documentation

## 2024-02-22 MED ORDER — BUSPIRONE HCL 7.5 MG PO TABS: 7.5000 mg | ORAL_TABLET | Freq: Two times a day (BID) | ORAL | 3 refills | Status: DC

## 2024-02-22 NOTE — Progress Notes (Signed)
 Subjective:  Patient ID: Emma Johnson, female    DOB: 1976/07/16  Age: 48 y.o. MRN: [id number]  CC:   Chief Complaint  Patient presents with   Anxiety    Discuss medication - may have started perimenopause symptoms per pt     HPI:  48 year old female presents for follow-up.  Patient states that she continues to have significant anxiety.  She states that she is in perimenopause and is gaining weight.  She states that she is quite fatigued and sleeps a lot.  She states that she feels like she is running herself thin.  Patient states that Lexapro  has helped but she is still quite anxious.  Will discuss additional treatment options today.  Patient Active Problem List   Diagnosis Date Noted   Complex regional pain syndrome i of right lower limb 02/22/2024   Opioid dependence (HCC) 02/22/2024   S/P lumbar fusion 12/10/2022   Generalized anxiety disorder 11/12/2012    Social Hx   Social History   Socioeconomic History   Marital status: Single    Spouse name: Not on file   Number of children: Not on file   Years of education: Not on file   Highest education level: Associate degree: academic program  Occupational History   Not on file  Tobacco Use   Smoking status: Every Day    Current packs/day: 0.50    Average packs/day: 0.5 packs/day for 20.0 years (10.0 ttl pk-yrs)    Types: Cigarettes   Smokeless tobacco: Never   Tobacco comments:    trying to quit  Vaping Use   Vaping status: Never Used  Substance and Sexual Activity   Alcohol use: No   Drug use: No    Comment: No Hx IV Abuse   Sexual activity: Yes    Birth control/protection: None, Other-see comments  Other Topics Concern   Not on file  Social History Narrative   Not on file   Social Drivers of Health   Financial Resource Strain: Low Risk  (07/01/2023)   Overall Financial Resource Strain (CARDIA)    Difficulty of Paying Living Expenses: Not hard at all  Food Insecurity: No Food Insecurity  (07/01/2023)   Hunger Vital Sign    Worried About Running Out of Food in the Last Year: Never true    Ran Out of Food in the Last Year: Never true  Transportation Needs: No Transportation Needs (07/01/2023)   PRAPARE - Administrator, Civil Service (Medical): No    Lack of Transportation (Non-Medical): No  Physical Activity: Insufficiently Active (07/01/2023)   Exercise Vital Sign    Days of Exercise per Week: 2 days    Minutes of Exercise per Session: 30 min  Stress: No Stress Concern Present (07/01/2023)   [employer] of Occupational Health - Occupational Stress Questionnaire    Feeling of Stress : Only a little  Social Connections: Moderately Integrated (07/01/2023)   Social Connection and Isolation Panel    Frequency of Communication with Friends and Family: More than three times a week    Frequency of Social Gatherings with Friends and Family: More than three times a week    Attends Religious Services: 1 to 4 times per year    Active Member of [employer] or Organizations: No    Attends Banker Meetings: Never    Marital Status: Living with partner    Review of Systems Per HPI  Objective:  BP 116/76   Pulse 82   Temp  98.1 F (36.7 C)   Ht 5' 9 (1.753 m)   Wt 228 lb (103.4 kg)   SpO2 98%   BMI 33.67 kg/m      02/22/2024    8:53 AM 09/09/2023   10:24 AM 08/26/2023    9:51 AM  BP/Weight  Systolic BP 116 106 119  Diastolic BP 76 70 75  Wt. (Lbs) 228  221  BMI 33.67 kg/m2  32.64 kg/m2    Physical Exam Vitals and nursing note reviewed.  Constitutional:      General: She is not in acute distress.    Appearance: Normal appearance.  HENT:     Head: Normocephalic and atraumatic.  Eyes:     General:        Right eye: No discharge.        Left eye: No discharge.     Conjunctiva/sclera: Conjunctivae normal.  Cardiovascular:     Rate and Rhythm: Normal rate and regular rhythm.  Pulmonary:     Effort: Pulmonary effort is normal.      Breath sounds: Normal breath sounds.  Neurological:     Mental Status: She is alert.  Psychiatric:        Behavior: Behavior normal.     Comments: Anxious.     Lab Results  Component Value Date   WBC 5.9 12/31/2022   HGB 14.5 12/31/2022   HCT 45.1 12/31/2022   PLT 178 12/31/2022   GLUCOSE 90 12/31/2022   CHOL 158 12/31/2022   TRIG 104 12/31/2022   HDL 40 12/31/2022   LDLCALC 99 12/31/2022   ALT 14 12/31/2022   AST 15 12/31/2022   NA 141 12/31/2022   K 4.8 12/31/2022   CL 106 12/31/2022   CREATININE 0.73 12/31/2022   BUN 12 12/31/2022   CO2 22 12/31/2022   INR 1.1 12/04/2022     Assessment & Plan:  Generalized anxiety disorder Assessment & Plan: Remains uncontrolled.  Adding BuSpar .  Orders: -     busPIRone  HCl; Take 1 tablet (7.5 mg total) by mouth 2 (two) times daily.  Dispense: 60 tablet; Refill: 3    Follow-up:  6 months  Kimbrely Buckel Bluford DO [facility]

## 2024-02-22 NOTE — Patient Instructions (Signed)
Medication as directed.  Follow up in 6 months or sooner if needed.  Take care  Dr. Mayre Bury  

## 2024-02-22 NOTE — Assessment & Plan Note (Signed)
 Remains uncontrolled.  Adding BuSpar .

## 2024-08-24 ENCOUNTER — Ambulatory Visit: Admitting: Family Medicine

## 2024-08-24 ENCOUNTER — Encounter: Payer: Self-pay | Admitting: Family Medicine

## 2024-08-24 VITALS — BP 118/62 | HR 76 | Temp 97.9°F | Ht 69.0 in | Wt 238.0 lb

## 2024-08-24 DIAGNOSIS — E669 Obesity, unspecified: Secondary | ICD-10-CM | POA: Insufficient documentation

## 2024-08-24 DIAGNOSIS — E66811 Obesity, class 1: Secondary | ICD-10-CM | POA: Diagnosis not present

## 2024-08-24 DIAGNOSIS — Z1211 Encounter for screening for malignant neoplasm of colon: Secondary | ICD-10-CM

## 2024-08-24 DIAGNOSIS — F419 Anxiety disorder, unspecified: Secondary | ICD-10-CM | POA: Diagnosis not present

## 2024-08-24 DIAGNOSIS — F32A Depression, unspecified: Secondary | ICD-10-CM

## 2024-08-24 MED ORDER — ESCITALOPRAM OXALATE 20 MG PO TABS: 20.0000 mg | ORAL_TABLET | Freq: Every day | ORAL | 11 refills | Status: AC

## 2024-08-24 MED ORDER — BUSPIRONE HCL 7.5 MG PO TABS: 7.5000 mg | ORAL_TABLET | Freq: Two times a day (BID) | ORAL | 11 refills | Status: AC

## 2024-08-24 NOTE — Assessment & Plan Note (Signed)
 Patient states that she feels like her mood is stable.  Continue Lexapro  and BuSpar .  No changes at this time.

## 2024-08-24 NOTE — Patient Instructions (Signed)
 Watch diet closely. Make small, sustainable changes.  Meds refilled.  Follow up in 6 months.  Referral placed for colonoscopy.

## 2024-08-24 NOTE — Assessment & Plan Note (Signed)
 Discussed recommendations regarding diet and calorie tracking as well as calorie deficit.  Briefly discussed that there are medications for obesity if needed.  Patient wants to try and do this naturally.  She is hoping to make significant strides over the next 6 months.

## 2024-08-24 NOTE — Progress Notes (Signed)
 "  Subjective:  Patient ID: Emma Johnson, female    DOB: 07-04-1976  Age: 49 y.o. MRN: [id number]  CC:   Chief Complaint  Patient presents with   medication check    refills    HPI:  49 year old female presents for follow-up.  Patient endorses weight gain and lack of motivation.  PHQ-9 score 11 and GAD-7 score of 15 here today.  Will discuss.  Needs refills on Lexapro  and BuSpar .  Patient reports weight gain over the past 6 months.  She is limited in her physical activity due to complex regional pain syndrome.  Will discuss recommendations for weight loss today.  Patient is due for colonoscopy.  We have previously discussed this.  She is amenable to referral.  Will place today.  Patient Active Problem List   Diagnosis Date Noted   Anxiety and depression 08/24/2024   Obesity (BMI 30-39.9) 08/24/2024   Complex regional pain syndrome i of right lower limb 02/22/2024   Opioid dependence (HCC) 02/22/2024   S/P lumbar fusion 12/10/2022    Social Hx   Social History   Socioeconomic History   Marital status: Single    Spouse name: Not on file   Number of children: Not on file   Years of education: Not on file   Highest education level: Associate degree: academic program  Occupational History   Not on file  Tobacco Use   Smoking status: Every Day    Current packs/day: 0.50    Average packs/day: 0.5 packs/day for 20.0 years (10.0 ttl pk-yrs)    Types: Cigarettes   Smokeless tobacco: Never   Tobacco comments:    trying to quit  Vaping Use   Vaping status: Never Used  Substance and Sexual Activity   Alcohol use: No   Drug use: No    Comment: No Hx IV Abuse   Sexual activity: Yes    Birth control/protection: None, Other-see comments  Other Topics Concern   Not on file  Social History Narrative   Not on file   Social Drivers of Health   Tobacco Use: High Risk (08/24/2024)   Patient History    Smoking Tobacco Use: Every Day    Smokeless Tobacco Use: Never     Passive Exposure: Not on file  Financial Resource Strain: Low Risk (07/01/2023)   Overall Financial Resource Strain (CARDIA)    Difficulty of Paying Living Expenses: Not hard at all  Food Insecurity: No Food Insecurity (07/01/2023)   Hunger Vital Sign    Worried About Running Out of Food in the Last Year: Never true    Ran Out of Food in the Last Year: Never true  Transportation Needs: No Transportation Needs (07/01/2023)   PRAPARE - Administrator, Civil Service (Medical): No    Lack of Transportation (Non-Medical): No  Physical Activity: Insufficiently Active (07/01/2023)   Exercise Vital Sign    Days of Exercise per Week: 2 days    Minutes of Exercise per Session: 30 min  Stress: No Stress Concern Present (07/01/2023)   [employer] of Occupational Health - Occupational Stress Questionnaire    Feeling of Stress : Only a little  Social Connections: Moderately Integrated (07/01/2023)   Social Connection and Isolation Panel    Frequency of Communication with Friends and Family: More than three times a week    Frequency of Social Gatherings with Friends and Family: More than three times a week    Attends Religious Services: 1 to 4 times per  year    Active Member of Clubs or Organizations: No    Attends Banker Meetings: Never    Marital Status: Living with partner  Depression (PHQ2-9): High Risk (08/24/2024)   Depression (PHQ2-9)    PHQ-2 Score: 11  Alcohol Screen: Low Risk (07/01/2023)   Alcohol Screen    Last Alcohol Screening Score (AUDIT): 0  Housing: Low Risk (07/01/2023)   Housing    Last Housing Risk Score: 0  Utilities: Not At Risk (07/01/2023)   AHC Utilities    Threatened with loss of utilities: No  Health Literacy: Adequate Health Literacy (07/01/2023)   B1300 Health Literacy    Frequency of need for help with medical instructions: Never    Review of Systems Per HPI  Objective:  BP 118/62   Pulse 76   Temp 97.9 F (36.6 C)   Ht  5' 9 (1.753 m)   Wt 238 lb (108 kg)   SpO2 97%   BMI 35.15 kg/m      08/24/2024    8:47 AM 02/22/2024    8:53 AM 09/09/2023   10:24 AM  BP/Weight  Systolic BP 118 116 106  Diastolic BP 62 76 70  Wt. (Lbs) 238 228   BMI 35.15 kg/m2 33.67 kg/m2     Physical Exam Vitals and nursing note reviewed.  Constitutional:      General: She is not in acute distress.    Appearance: Normal appearance.  HENT:     Head: Normocephalic and atraumatic.  Cardiovascular:     Rate and Rhythm: Normal rate and regular rhythm.  Pulmonary:     Effort: Pulmonary effort is normal.     Breath sounds: Normal breath sounds.  Neurological:     Mental Status: She is alert.  Psychiatric:        Mood and Affect: Mood normal.        Behavior: Behavior normal.     Lab Results  Component Value Date   WBC 5.9 12/31/2022   HGB 14.5 12/31/2022   HCT 45.1 12/31/2022   PLT 178 12/31/2022   GLUCOSE 90 12/31/2022   CHOL 158 12/31/2022   TRIG 104 12/31/2022   HDL 40 12/31/2022   LDLCALC 99 12/31/2022   ALT 14 12/31/2022   AST 15 12/31/2022   NA 141 12/31/2022   K 4.8 12/31/2022   CL 106 12/31/2022   CREATININE 0.73 12/31/2022   BUN 12 12/31/2022   CO2 22 12/31/2022   INR 1.1 12/04/2022     Assessment & Plan:  Anxiety and depression Assessment & Plan: Patient states that she feels like her mood is stable.  Continue Lexapro  and BuSpar .  No changes at this time.  Orders: -     busPIRone  HCl; Take 1 tablet (7.5 mg total) by mouth 2 (two) times daily.  Dispense: 60 tablet; Refill: 11 -     Escitalopram  Oxalate; Take 1 tablet (20 mg total) by mouth daily.  Dispense: 30 tablet; Refill: 11  Encounter for screening colonoscopy -     Ambulatory referral to Gastroenterology  Obesity (BMI 30-39.9) Assessment & Plan: Discussed recommendations regarding diet and calorie tracking as well as calorie deficit.  Briefly discussed that there are medications for obesity if needed.  Patient wants to try and do  this naturally.  She is hoping to make significant strides over the next 6 months.     Follow-up: 6 months  Joren Rehm Bluford DO [facility] "

## 2024-09-08 ENCOUNTER — Encounter: Admitting: Adult Health

## 2025-02-21 ENCOUNTER — Ambulatory Visit: Admitting: Family Medicine
